# Patient Record
Sex: Female | Born: 1985 | Race: White | Hispanic: No | Marital: Married | Smoking: Former smoker
Health system: Southern US, Community
[De-identification: ages and names within clinical notes are randomized; demographics above are authoritative.]

## PROBLEM LIST (undated history)

## (undated) DIAGNOSIS — O139 Gestational [pregnancy-induced] hypertension without significant proteinuria, unspecified trimester: Secondary | ICD-10-CM

## (undated) DIAGNOSIS — F419 Anxiety disorder, unspecified: Secondary | ICD-10-CM

## (undated) DIAGNOSIS — F319 Bipolar disorder, unspecified: Secondary | ICD-10-CM

## (undated) DIAGNOSIS — I10 Essential (primary) hypertension: Secondary | ICD-10-CM

## (undated) DIAGNOSIS — F329 Major depressive disorder, single episode, unspecified: Secondary | ICD-10-CM

## (undated) DIAGNOSIS — IMO0002 Reserved for concepts with insufficient information to code with codable children: Secondary | ICD-10-CM

## (undated) DIAGNOSIS — F32A Depression, unspecified: Secondary | ICD-10-CM

## (undated) HISTORY — DX: Major depressive disorder, single episode, unspecified: F32.9

## (undated) HISTORY — DX: Essential (primary) hypertension: I10

## (undated) HISTORY — DX: Depression, unspecified: F32.A

## (undated) HISTORY — DX: Gestational (pregnancy-induced) hypertension without significant proteinuria, unspecified trimester: O13.9

## (undated) HISTORY — DX: Reserved for concepts with insufficient information to code with codable children: IMO0002

## (undated) HISTORY — DX: Anxiety disorder, unspecified: F41.9

---
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
## 2006-02-22 DIAGNOSIS — O139 Gestational [pregnancy-induced] hypertension without significant proteinuria, unspecified trimester: Secondary | ICD-10-CM

## 2006-02-22 HISTORY — DX: Gestational (pregnancy-induced) hypertension without significant proteinuria, unspecified trimester: O13.9

## 2006-05-08 ENCOUNTER — Observation Stay: Payer: Self-pay

## 2009-02-22 HISTORY — PX: WISDOM TOOTH EXTRACTION: SHX21

## 2009-05-07 ENCOUNTER — Emergency Department: Payer: Self-pay | Admitting: Emergency Medicine

## 2009-08-27 ENCOUNTER — Emergency Department (HOSPITAL_COMMUNITY): Admission: EM | Admit: 2009-08-27 | Discharge: 2009-08-28 | Payer: Self-pay | Admitting: Emergency Medicine

## 2010-01-07 ENCOUNTER — Emergency Department (HOSPITAL_COMMUNITY): Admission: EM | Admit: 2010-01-07 | Discharge: 2010-01-07 | Payer: Self-pay | Admitting: Emergency Medicine

## 2010-11-18 ENCOUNTER — Encounter: Payer: Self-pay | Admitting: Gynecology

## 2010-11-18 ENCOUNTER — Ambulatory Visit: Payer: Medicaid Other | Admitting: Gynecology

## 2010-11-18 VITALS — BP 124/68 | Wt 217.0 lb

## 2010-11-18 DIAGNOSIS — Z3480 Encounter for supervision of other normal pregnancy, unspecified trimester: Secondary | ICD-10-CM

## 2010-11-19 LAB — OBSTETRIC PANEL
Antibody Screen: NEGATIVE
Basophils Absolute: 0 10*3/uL (ref 0.0–0.1)
Basophils Relative: 0 % (ref 0–1)
Eosinophils Absolute: 0.3 10*3/uL (ref 0.0–0.7)
Eosinophils Relative: 2 % (ref 0–5)
HCT: 35.2 % — ABNORMAL LOW (ref 36.0–46.0)
MCHC: 34.4 g/dL (ref 30.0–36.0)
Monocytes Absolute: 0.7 10*3/uL (ref 0.1–1.0)
Neutro Abs: 7.6 10*3/uL (ref 1.7–7.7)
RDW: 13 % (ref 11.5–15.5)

## 2010-11-19 LAB — HEPATITIS B CORE ANTIBODY, IGM: Hep B C IgM: NEGATIVE

## 2010-11-19 LAB — HIV ANTIBODY (ROUTINE TESTING W REFLEX): HIV: NONREACTIVE

## 2010-11-20 LAB — CULTURE, URINE COMPREHENSIVE
Colony Count: NO GROWTH
Organism ID, Bacteria: NO GROWTH

## 2010-11-25 ENCOUNTER — Other Ambulatory Visit: Payer: Self-pay | Admitting: Gynecology

## 2010-11-25 ENCOUNTER — Ambulatory Visit (HOSPITAL_COMMUNITY)
Admission: RE | Admit: 2010-11-25 | Discharge: 2010-11-25 | Disposition: A | Discharge status: Home / Self Care | Payer: Medicaid Other | Source: Ambulatory Visit | Attending: Obstetrics and Gynecology | Admitting: Obstetrics and Gynecology

## 2010-11-25 DIAGNOSIS — Z3689 Encounter for other specified antenatal screening: Secondary | ICD-10-CM | POA: Insufficient documentation

## 2010-11-25 DIAGNOSIS — Z3480 Encounter for supervision of other normal pregnancy, unspecified trimester: Secondary | ICD-10-CM

## 2010-12-08 ENCOUNTER — Ambulatory Visit (INDEPENDENT_AMBULATORY_CARE_PROVIDER_SITE_OTHER): Payer: Medicaid Other | Admitting: Obstetrics and Gynecology

## 2010-12-08 ENCOUNTER — Other Ambulatory Visit (HOSPITAL_COMMUNITY)
Admission: RE | Admit: 2010-12-08 | Discharge: 2010-12-08 | Disposition: A | Discharge status: Home / Self Care | Payer: Medicaid Other | Source: Ambulatory Visit | Attending: Obstetrics and Gynecology | Admitting: Obstetrics and Gynecology

## 2010-12-08 DIAGNOSIS — Z01419 Encounter for gynecological examination (general) (routine) without abnormal findings: Secondary | ICD-10-CM | POA: Insufficient documentation

## 2010-12-08 DIAGNOSIS — Z3480 Encounter for supervision of other normal pregnancy, unspecified trimester: Secondary | ICD-10-CM

## 2010-12-08 DIAGNOSIS — Z113 Encounter for screening for infections with a predominantly sexual mode of transmission: Secondary | ICD-10-CM | POA: Insufficient documentation

## 2010-12-08 NOTE — Progress Notes (Signed)
Please collect quad screen

## 2010-12-08 NOTE — Patient Instructions (Signed)
Pregnancy - First Trimester During sexual intercourse, millions of sperm go into the vagina. Only 1 sperm will penetrate and fertilize the female egg while it is in the Fallopian tube. One week later, the fertilized egg implants into the wall of the uterus. An embryo begins to develop into a baby. At 6 to 8 weeks, the eyes and face are formed and the heartbeat can be seen on ultrasound. At the end of 12 weeks (first trimester), all the baby's organs are formed. Now that you are pregnant, you will want to do everything you can to have a healthy baby. Two of the most important things are to get good prenatal care and follow your caregiver's instructions. Prenatal care is all the medical care you receive before the baby's birth. It is given to prevent, find and treat problems during the pregnancy and childbirth. PRENATAL EXAMS:  During prenatal visits, your weight, blood pressure and urine are checked. This is done to make sure you are healthy and progressing normally during the pregnancy.   A pregnant woman should gain 25 to 35 pounds during the pregnancy. However, if you are over weight or underweight, your caregiver will advise you regarding your weight.   Your caregiver will ask and answer questions for you.   Blood work, cervical cultures, other necessary tests and a Pap test are done during your prenatal exams. These tests are done to check on your health and the probable health of your baby. Tests are strongly recommended and done for HIV with your permission. This is the virus that causes AIDS. These tests are done because medications can be given to help prevent your baby from being born with this infection should you have been infected without knowing it. Blood work is also used to find out your blood type, previous infections and follow your blood levels (hemoglobin).   Low hemoglobin (anemia) is common during pregnancy. Iron and vitamins are given to help prevent this. Later in the pregnancy,  blood tests for diabetes will be done along with any other tests if any problems develop. You may need tests to make sure you and the baby are doing well.   You may need other tests to make sure you and the baby are doing well.  CHANGES DURING THE FIRST TRIMESTER (THE FIRST 3 MONTHS OF PREGNANCY) Your body goes through many changes during pregnancy. They vary from person to person. Talk to your caregiver about changes you notice and are concerned about. Changes can include:  Your menstrual period stops.   The egg and sperm carry the genes that determine what you look like. Genes from you and your partner are forming a baby. The female genes determine whether the baby is a boy or a girl.   Your body increases in girth and you may feel bloated.   Feeling sick to your stomach (nauseous) and throwing up (vomiting). If the vomiting is uncontrollable, call your caregiver.   Your breasts will begin to enlarge and become tender.   Your nipples may stick out more and become darker.   The need to urinate more. Painful urination may mean you have a bladder infection.   Tiring easily.   Loss of appetite.   Cravings for certain kinds of food.   At first, you may gain or lose a couple of pounds.   You may have changes in your emotions from day to day (excited to be pregnant or concerned something may go wrong with the pregnancy and baby).     You may have more vivid and strange dreams.  HOME CARE INSTRUCTIONS  It is very important to avoid all smoking, alcohol and un-prescribed drugs during your pregnancy. These affect the formation and growth of the baby. Avoid chemicals while pregnant to ensure the delivery of a healthy infant.   Start your prenatal visits by the 12th week of pregnancy. They are usually scheduled monthly at first, then more often in the last 2 months before delivery. Keep your caregiver's appointments. Follow your caregiver's instructions regarding medication use, blood and lab  tests, exercise, and diet.   During pregnancy, you are providing food for you and your baby. Eat regular, well-balanced meals. Choose foods such as meat, fish, milk and other low fat dairy products, vegetables, fruits, and whole-grain breads and cereals. Your caregiver will tell you of the ideal weight gain.   You can help morning sickness by keeping soda crackers (saltines) at the bedside. Eat a couple before arising in the morning. You may want to use the crackers without salt on them.   Eating 4 to 5 small meals rather than 3 large meals a day also may help the nausea and vomiting.   Drinking liquids between meals instead of during meals also seems to help nausea and vomiting.   A physical sexual relationship may be continued throughout pregnancy if there are no other problems. Problems may be early (premature) leaking of amniotic fluid from the membranes, vaginal bleeding, or belly (abdominal) pain.   Exercise regularly if there are no restrictions. Check with your caregiver or physical therapist if you are unsure of the safety of some of your exercises. Greater weight gain will occur in the last 2 trimesters of pregnancy. Exercising will help:   Control your weight.   Keep you in shape.   Prepare you for labor and delivery.   Help you lose your pregnancy weight after you deliver your baby.   Wear a good support or jogging bra for breast tenderness during pregnancy. This may help if worn during sleep too.   Ask when prenatal classes are available. Begin classes when they are offered.   Do not use hot tubs, steam rooms or saunas.   Wear your seat belt when driving. This protects you and your baby if you are in an accident.   Avoid raw meat, uncooked cheese, cat litter boxes and soil used by cats throughout the pregnancy. These carry germs that can cause birth defects in the baby.   The first trimester is a good time to visit your dentist for your dental health. Getting your teeth  cleaned is OK. Use a softer toothbrush and brush gently during pregnancy.   Ask for help if you have financial, counseling or nutritional needs during pregnancy. Your caregiver will be able to offer counseling for these needs as well as refer you for other special needs.   Do not take any medications or herbs unless told by your caregiver.   Inform your caregiver if there is any mental or physical domestic violence.   Make a list of emergency phone numbers of family, friends, hospital, police and fire department.   Write down your questions. Take them to your prenatal visit.   Do not douche.   Do not cross your legs.   If you have to stand for long periods of time, rotate you feet or take small steps in a circle.   You may have more vaginal secretions that may require a sanitary pad. Do not use tampons   or scented sanitary pads.  MEDICATIONS AND DRUG USE IN PREGNANCY  Take prenatal vitamins as directed. The vitamin should contain 1 milligram of folic acid. Keep all vitamins out of reach of children. Only a couple vitamins or tablets containing iron may be fatal to a baby or young child when ingested.   Avoid use of all medications, including herbs, over-the-counter medications, not prescribed or suggested by your caregiver. Only take over-the-counter or prescription medicines for pain, discomfort, or fever as directed by your caregiver. Do not use aspirin, ibuprofen (Motrin, Advil, Nuprin) or naproxen (Aleve) unless OK'd by your caregiver.   Let your caregiver also know about herbs you may be using.   Alcohol is related to a number of birth defects. This includes fetal alcohol syndrome. All alcohol, in any form, should be avoided completely. Smoking will cause low birth rate and premature babies.   Street/illegal drugs are very harmful to the baby. They are absolutely forbidden. A baby born to an addicted mother will be addicted at birth. The baby will go through the same withdrawal  an adult does.   Let your caregiver know about any medications that you have to take and for what reason you take them.  MISCARRIAGE IS COMMON DURING PREGNANCY A miscarriage does not mean you did something wrong. It is not a reason to worry about getting pregnant again. Your caregiver will help you with questions you may have. If you have a miscarriage, you may need minor surgery (a D & C). SEEK MEDICAL CARE IF:  You have any concerns or worries during your pregnancy. It is better to call with your questions if you feel they cannot wait, rather than worry about them.  SEEK IMMEDIATE MEDICAL CARE IF:  An unexplained oral temperature above 100.4 develops, or as your caregiver suggests.   You have leaking of fluid from the vagina (birth canal). If leaking membranes are suspected, take your temperature and inform your caregiver of this when you call.   There is vaginal spotting or bleeding. Notify your caregiver of the amount and how many pads are used.   You develop a bad smelling vaginal discharge with a change in the color.   You continue to feel sick to your stomach (nauseated) and have no relief from remedies suggested. You vomit blood or coffee ground like materials.   You lose more than 2 pounds of weight in one week.   You gain more than 2 pounds of weight in a week and you notice swelling of your face, hands, feet or legs.   You gain 5 pounds or more in 1 week (even if you do not have swelling of your hands, face, legs or feet).   You get exposed to [place] measles and have never had them.   You are exposed to fifth disease or chicken pox.   You develop belly (abdominal) pain. Round ligament discomfort is a common non-cancerous (benign) cause of abdominal pain in pregnancy. Your caregiver still must evaluate this.   You develop headache, fever, diarrhea, pain with urination, or shortness of breath.   You fall, are in a car accident or have any kind of trauma.   There is mental  or physical violence in your home.  Document Released: 02/02/2001 Document Re-Released: 07/29/2009 [facility] Patient Information 2011 [place], [place]. Pregnancy - Second Trimester The second trimester of pregnancy (3 to 6 months) is a period of rapid growth for you and your baby. At the end of the sixth month, your  baby is about 9 inches long and weighs 1 1/2 pounds. You will begin to feel the baby move between 18 and 20 weeks of the pregnancy. This is called quickening. Weight gain is faster. A clear fluid (colostrum) may leak out of your breasts. You may feel small contractions of the womb (uterus). This is known as false labor or Braxton-Hicks contractions. This is like a practice for labor when the baby is ready to be born. Usually, the problems with morning sickness have usually passed by the end of your first trimester. Some women develop small dark blotches (called cholasma, mask of pregnancy) on their face that usually goes away after the baby is born. Exposure to the sun makes the blotches worse. Acne may also develop in some pregnant women and pregnant women who have acne, may find that it goes away. PRENATAL EXAMS  Blood work may continue to be done during prenatal exams. These tests are done to check on your health and the probable health of your baby. Blood work is used to follow your blood levels (hemoglobin). Anemia (low hemoglobin) is common during pregnancy. Iron and vitamins are given to help prevent this. You will also be checked for diabetes between 24 and 28 weeks of the pregnancy. Some of the previous blood tests may be repeated.   The size of the uterus is measured during each visit. This is to make sure that the baby is continuing to grow properly according to the dates of the pregnancy.   Your blood pressure is checked every prenatal visit. This is to make sure you are not getting toxemia.   Your urine is checked to make sure you do not have an infection, diabetes or protein in  the urine.   Your weight is checked often to make sure gains are happening at the suggested rate. This is to ensure that both you and your baby are growing normally.   Sometimes, an ultrasound is performed to confirm the proper growth and development of the baby. This is a test which bounces harmless sound waves off the baby so your caregiver can more accurately determine due dates.  Sometimes, a specialized test is done on the amniotic fluid surrounding the baby. This test is called an amniocentesis. The amniotic fluid is obtained by sticking a needle into the belly (abdomen). This is done to check the chromosomes in instances where there is a concern about possible genetic problems with the baby. It is also sometimes done near the end of pregnancy if an early delivery is required. In this case, it is done to help make sure the baby's lungs are mature enough for the baby to live outside of the womb. CHANGES OCCURING IN THE SECOND TRIMESTER OF PREGNANCY Your body goes through many changes during pregnancy. They vary from person to person. Talk to your caregiver about changes you notice that you are concerned about.  During the second trimester, you will likely have an increase in your appetite. It is normal to have cravings for certain foods. This varies from person to person and pregnancy to pregnancy.   Your lower abdomen will begin to bulge.   You may have to urinate more often because the uterus and baby are pressing on your bladder. It is also common to get more bladder infections during pregnancy (pain with urination). You can help this by drinking lots of fluids and emptying your bladder before and after intercourse.   You may begin to get stretch marks on your hips,  abdomen, and breasts. These are normal changes in the body during pregnancy. There are no exercises or medications to take that prevent this change.   You may begin to develop swollen and bulging veins (varicose veins) in your  legs. Wearing support hose, elevating your feet for 15 minutes, 3 to 4 times a day and limiting salt in your diet helps lessen the problem.   Heartburn may develop as the uterus grows and pushes up against the stomach. Antacids recommended by your caregiver helps with this problem. Also, eating smaller meals 4 to 5 times a day helps.   Constipation can be treated with a stool softener or adding bulk to your diet. Drinking lots of fluids, vegetables, fruits, and whole grains are helpful.   Exercising is also helpful. If you have been very active up until your pregnancy, most of these activities can be continued during your pregnancy. If you have been less active, it is helpful to start an exercise program such as walking.   Hemorrhoids (varicose veins in the rectum) may develop at the end of the second trimester. Warm sitz baths and hemorrhoid cream recommended by your caregiver helps hemorrhoid problems.   Backaches may develop during this time of your pregnancy. Avoid heavy lifting, wear low heal shoes and practice good posture to help with backache problems.   Some pregnant women develop tingling and numbness of their hand and fingers because of swelling and tightening of ligaments in the wrist (carpel tunnel syndrome). This goes away after the baby is born.   As your breasts enlarge, you may have to get a bigger bra. Get a comfortable, cotton, support bra. Do not get a nursing bra until the last month of the pregnancy if you will be nursing the baby.   You may get a dark line from your belly button to the pubic area called the linea nigra.   You may develop rosy cheeks because of increase blood flow to the face.   You may develop spider looking lines of the face, neck, arms and chest. These go away after the baby is born.  HOME CARE INSTRUCTIONS  It is extremely important to avoid all smoking, herbs, alcohol, and un-prescribed drugs during your pregnancy. These chemicals affect the  formation and growth of the baby. Avoid these chemicals throughout the pregnancy to ensure the delivery of a healthy infant.   Most of your home care instructions are the same as suggested for the first trimester of your pregnancy. Keep your caregiver's appointments. Follow your caregiver's instructions regarding medication use, exercise and diet.   During pregnancy, you are providing food for you and your baby. Continue to eat regular, well-balanced meals. Choose foods such as meat, fish, milk and other low fat dairy products, vegetables, fruits, and whole-grain breads and cereals. Your caregiver will tell you of the ideal weight gain.   A physical sexual relationship may be continued up until near the end of pregnancy if there are no other problems. Problems could include early (premature) leaking of amniotic fluid from the membranes, vaginal bleeding, abdominal pain, or other medical or pregnancy problems.   Exercise regularly if there are no restrictions. Check with your caregiver if you are unsure of the safety of some of your exercises. The greatest weight gain will occur in the last 2 trimesters of pregnancy. Exercise will help you:   Control your weight.   Get you in shape for labor and delivery.   Lose weight after you have the baby.  Wear a good support or jogging bra for breast tenderness during pregnancy. This may help if worn during sleep. Pads or tissues may be used in the bra if you are leaking colostrum.   Do not use hot tubs, steam rooms or saunas throughout the pregnancy.   Wear your seat belt at all times when driving. This protects you and your baby if you are in an accident.   Avoid raw meat, uncooked cheese, cat litter boxes and soil used by cats. These carry germs that can cause birth defects in the baby.   The second trimester is also a good time to visit your dentist for your dental health if this has not been done yet. Getting your teeth cleaned is OK. Use a soft  toothbrush. Brush gently during pregnancy.   It is easier to loose urine during pregnancy. Tightening up and strengthening the pelvic muscles will help with this problem. Practice stopping your urination while you are going to the bathroom. These are the same muscles you need to strengthen. It is also the muscles you would use as if you were trying to stop from passing gas. You can practice tightening these muscles up 10 times a set and repeating this about 3 times per day. Once you know what muscles to tighten up, do not perform these exercises during urination. It is more likely to contribute to an infection by backing up the urine.   Ask for help if you have financial, counseling or nutritional needs during pregnancy. Your caregiver will be able to offer counseling for these needs as well as refer you for other special needs.   Your skin may become oily. If so, wash your face with mild soap, use non-greasy moisturizer and oil or cream based makeup.  MEDICATIONS AND DRUG USE IN PREGNANCY  Take prenatal vitamins as directed. The vitamin should contain 1 milligram of folic acid. Keep all vitamins out of reach of children. Only a couple vitamins or tablets containing iron may be fatal to a baby or young child when ingested.   Avoid use of all medications, including herbs, over-the-counter medications, not prescribed or suggested by your caregiver. Only take over-the-counter or prescription medicines for pain, discomfort, or fever as directed by your caregiver. Do not use aspirin.   Let your caregiver also know about herbs you may be using.   Alcohol is related to a number of birth defects. This includes fetal alcohol syndrome. All alcohol, in any form, should be avoided completely. Smoking will cause low birth rate and premature babies.   Street/illegal drugs are very harmful to the baby. They are absolutely forbidden. A baby born to an addicted mother will be addicted at birth. The baby will go  through the same withdrawal an adult does.  SEEK MEDICAL CARE IF:  You have any concerns or worries during your pregnancy. It is better to call with your questions if you feel they cannot wait, rather than worry about them.  SEEK IMMEDIATE MEDICAL CARE IF:  An unexplained oral temperature above 100.4 develops, or as your caregiver suggests.   You have leaking of fluid from the vagina (birth canal). If leaking membranes are suspected, take your temperature and tell your caregiver of this when you call.   There is vaginal spotting, bleeding, or passing clots. Tell your caregiver of the amount and how many pads are used. Light spotting in pregnancy is common, especially following intercourse.   You develop a bad smelling vaginal discharge with a change  in the color from clear to white.   You continue to feel sick to your stomach (nauseated) and have no relief from remedies suggested. You vomit blood or coffee ground like materials.   You loose more than 2 pounds of weight or gain more than 2 pounds of weight over a weeks time, or as suggested by your caregiver.   You notice swelling of your face, hands, and feet or legs.   You get exposed to [place] measles and have never had them.   You are exposed to fifth disease or chicken pox.   You develop belly (abdominal) pain. Round ligament discomfort is a common non-cancerous (benign) cause of abdominal pain in pregnancy. Your caregiver still must evaluate you.   You develop a bad headache that does not go away.   You develop fever, diarrhea, pain with urination or shortness of breath.   You develop visual problems, blurry or double vision.   You fall, are in a car accident or any kind of trauma.   There is mental or physical violence at home.  Document Released: 02/02/2001 Document Re-Released: 05/05/2009 [facility] Patient Information 2011 [place], [place].

## 2010-12-08 NOTE — Progress Notes (Signed)
Addended by: Catalina Antigua on: 12/08/2010 03:08 PM   Modules accepted: Kipp Brood

## 2010-12-08 NOTE — Progress Notes (Signed)
   Subjective:    Lisa Rich is a G2P1001 [redacted]w[redacted]d being seen today for her first obstetrical visit.  Her obstetrical history is significant for pregnancy induced hypertension. Patient states that she was never started on any medicationsatient does intend to breast feed. Pregnancy history fully reviewed. Patient with h/o depression but not taking or feel that she needs medical intervention at this time  Patient reports no complaints.  Filed Vitals:   12/08/10 1400  BP: 119/74  Weight: 217 lb (98.431 kg)    HISTORY: OB History    Grav Para Term Preterm Abortions TAB SAB Ect Mult Living   2 1 1       1      # Outc Date GA Lbr Len/2nd Wgt Sex Del Anes PTL Lv   1 TRM 3/08 [redacted]w[redacted]d  7lb10oz(3.459kg) M SVD EPI  Yes   2 CUR              Past Medical History  Diagnosis Date  . Abnormal Pap smear 2007  . Pregnancy induced hypertension 2008    preclampsia  . Depression    Past Surgical History  Procedure Date  . Wisdom tooth extraction 2011     x 4   Family History  Problem Relation Age of Onset  . Hypertension Mother   . Depression Mother   . Hypertension Father   . Bipolar disorder Father   . Diabetes Maternal Grandmother   . Asthma Maternal Grandmother   . Heart disease Maternal Grandmother     CHF  . Heart disease Maternal Grandfather     CHF     Exam    Uterine Size: size equals dates  Pelvic Exam:    Perineum: Normal Perineum   Vulva: normal   Vagina:  normal mucosa, normal discharge   pH: n/a   Cervix: closed and long   Adnexa: normal adnexa and no mass, fullness, tenderness   Bony Pelvis: adequate  System: Breast:  normal appearance, no masses or tenderness, No nipple discharge or bleeding, No axillary or supraclavicular adenopathy   Skin: normal coloration and turgor, no rashes    Neurologic: oriented, grossly non-focal   Extremities: normal strength, tone, and muscle mass, no erythema, induration, or nodules   HEENT PERRLA   Mouth/Teeth mucous  membranes moist, pharynx normal without lesions   Neck supple and no masses   Cardiovascular: regular rate and rhythm   Respiratory:  chest clear to auscultation bilaterally   Abdomen: soft, non-tender; bowel sounds normal; no masses,  no organomegaly   Urinary: n/a      Assessment:    Pregnancy: G2P1001 There is no problem list on file for this patient.       Plan:     Initial labs drawn. Prenatal vitamins. Problem list reviewed and updated. Genetic Screening discussed Quad Screen: requested.  Ultrasound discussed; fetal survey: requested.  Follow up in 4 weeks.      Lisa Rich 12/08/2010

## 2010-12-08 NOTE — Progress Notes (Signed)
Addended by: Barbara Cower on: 12/08/2010 02:49 PM   Modules accepted: Orders

## 2010-12-17 ENCOUNTER — Inpatient Hospital Stay (HOSPITAL_COMMUNITY)
Admission: AD | Admit: 2010-12-17 | Discharge: 2010-12-17 | Disposition: A | Discharge status: Home / Self Care | Payer: Medicaid Other | Source: Ambulatory Visit | Attending: Obstetrics and Gynecology | Admitting: Obstetrics and Gynecology

## 2010-12-17 ENCOUNTER — Inpatient Hospital Stay (HOSPITAL_COMMUNITY): Payer: Medicaid Other

## 2010-12-17 ENCOUNTER — Encounter (HOSPITAL_COMMUNITY): Payer: Self-pay

## 2010-12-17 DIAGNOSIS — M549 Dorsalgia, unspecified: Secondary | ICD-10-CM

## 2010-12-17 DIAGNOSIS — R319 Hematuria, unspecified: Secondary | ICD-10-CM | POA: Insufficient documentation

## 2010-12-17 DIAGNOSIS — O26899 Other specified pregnancy related conditions, unspecified trimester: Secondary | ICD-10-CM

## 2010-12-17 DIAGNOSIS — O99891 Other specified diseases and conditions complicating pregnancy: Secondary | ICD-10-CM | POA: Insufficient documentation

## 2010-12-17 LAB — URINALYSIS, ROUTINE W REFLEX MICROSCOPIC
Glucose, UA: NEGATIVE mg/dL
Ketones, ur: 15 mg/dL — AB
Protein, ur: NEGATIVE mg/dL

## 2010-12-17 LAB — URINE MICROSCOPIC-ADD ON

## 2010-12-17 MED ORDER — PROMETHAZINE HCL 25 MG PO TABS: 25.0000 mg | ORAL_TABLET | Freq: Four times a day (QID) | ORAL | Status: AC | PRN

## 2010-12-17 NOTE — ED Provider Notes (Signed)
Agree with above note.  Lisa Rich H. 12/17/2010 6:54 PM

## 2010-12-17 NOTE — Progress Notes (Signed)
Patient states that she has been having bilateral flank pain that radiates to the lower abdomen. Was seen at [facility] today and had hematuria and was sent to MAU for evaluation.

## 2010-12-17 NOTE — Progress Notes (Signed)
Pt states blood noted in urine, having right sided lower back pain that wraps around to the left, mild CVA tenderness on R side only. Vaginal d/c white, milky, non-odorous. Denies vaginal bleeding. When pt voids, feels like a pulling sensation in lower abdomen.

## 2010-12-17 NOTE — Progress Notes (Signed)
TO US VIA W/C.

## 2010-12-17 NOTE — ED Provider Notes (Signed)
History   I have assumed care of this pt from Wynelle Bourgeois, CNM at 2013. Please see previous H&P.  Chief Complaint  Patient presents with  . Hematuria   HPI  OB History    Grav Para Term Preterm Abortions TAB SAB Ect Mult Living   2 1 1       1       Past Medical History  Diagnosis Date  . Abnormal Pap smear 2007  . Pregnancy induced hypertension 2008    preclampsia  . Depression     Past Surgical History  Procedure Date  . Wisdom tooth extraction 2011     x 4    Family History  Problem Relation Age of Onset  . Hypertension Mother   . Depression Mother   . Hypertension Father   . Bipolar disorder Father   . Diabetes Maternal Grandmother   . Asthma Maternal Grandmother   . Heart disease Maternal Grandmother     CHF  . Heart disease Maternal Grandfather     CHF    History  Substance Use Topics  . Smoking status: Current Some Day Smoker -- 0.2 packs/day    Types: Cigarettes  . Smokeless tobacco: Never Used  . Alcohol Use: No    Allergies: No Known Allergies  Prescriptions prior to admission  Medication Sig Dispense Refill  . acetaminophen (TYLENOL) 325 MG tablet Take 650 mg by mouth every 6 (six) hours as needed. For pain       . Prenatal Vit-Fe Fumarate-FA (MULTIVITAMIN-PRENATAL) 27-0.8 MG TABS Take 1 tablet by mouth daily.          ROS Physical Exam   Blood pressure 129/75, pulse 86, temperature 98.8 F (37.1 C), temperature source Oral, resp. rate 20, height 5\' 8"  (1.727 m), weight 217 lb 6.4 oz (98.612 kg), last menstrual period 09/04/2010, SpO2 99.00%.  Physical Exam  MAU Course  Procedures  Results for orders placed during the hospital encounter of 12/17/10 (from the past 24 hour(s))  URINALYSIS, ROUTINE W REFLEX MICROSCOPIC     Status: Abnormal   Collection Time   12/17/10  4:25 PM      Component Value Range   Color, Urine YELLOW  YELLOW    Appearance HAZY (*) CLEAR    Specific Gravity, Urine >1.030 (*) 1.005 - 1.030    pH 6.0  5.0 -  8.0    Glucose, UA NEGATIVE  NEGATIVE (mg/dL)   Hgb urine dipstick SMALL (*) NEGATIVE    Bilirubin Urine NEGATIVE  NEGATIVE    Ketones, ur 15 (*) NEGATIVE (mg/dL)   Protein, ur NEGATIVE  NEGATIVE (mg/dL)   Urobilinogen, UA 0.2  0.0 - 1.0 (mg/dL)   Nitrite NEGATIVE  NEGATIVE    Leukocytes, UA NEGATIVE  NEGATIVE   URINE MICROSCOPIC-ADD ON     Status: Abnormal   Collection Time   12/17/10  4:25 PM      Component Value Range   Squamous Epithelial / LPF FEW (*) RARE    WBC, UA 0-2  <3 (WBC/hpf)   RBC / HPF 3-6  <3 (RBC/hpf)   Bacteria, UA FEW (*) RARE    Crystals CA OXALATE CRYSTALS (*) NEGATIVE    Urine-Other MUCOUS PRESENT     US Renal  12/17/2010  *RADIOLOGY REPORT*  Clinical Data: Back pain.  Microhematuria. Calcium oxylate crystals.   Pregnant.  RENAL/URINARY TRACT ULTRASOUND COMPLETE  Comparison:  None.  Findings:  Right Kidney:  No hydronephrosis.  Well-preserved cortex.  Normal size and  parenchymal echotexture without focal abnormalities.   The maximal length is 10.7 cm.  Left Kidney:  No hydronephrosis.  Well-preserved cortex.  Normal size and parenchymal echotexture without focal abnormalities.   The maximal length is 11.2 cm  Bladder:  Bilateral ureteral jets are noted.  Incidental note is made of the pregnancy.  IMPRESSION: Normal appearance of the kidneys bilaterally without evidence for nephrolithiasis or hydronephrosis.  Original Report Authenticated By: Jamesetta Orleans. MATTERN, M.D.     Assessment and Plan  Back pain/hematuria: will await urine culture. Pt has f/u in the office scheduled. Willl give Rx for phenergan. Discussed diet, activity, risks, and precautions.  Clinton Gallant. Rice III, DrHSc, MPAS, PA-C  12/17/2010, 8:13 PM   Henrietta Hoover, PA 12/17/10 2015

## 2010-12-17 NOTE — ED Provider Notes (Signed)
History     Chief Complaint  Patient presents with  . Hematuria   HPI Lisa Rich is a 25 y.o. G1P0 at [redacted]w[redacted]d Who was seen at [facility] with c/o bilateral back pain which wraps around to front for several days. States they told her she had blood in her urine but "no signs of infection".  Was sent here for further eval .. No fever.   Past Medical History  Diagnosis Date  . Abnormal Pap smear 2007  . Pregnancy induced hypertension 2008    preclampsia  . Depression     Past Surgical History  Procedure Date  . Wisdom tooth extraction 2011     x 4    Family History  Problem Relation Age of Onset  . Hypertension Mother   . Depression Mother   . Hypertension Father   . Bipolar disorder Father   . Diabetes Maternal Grandmother   . Asthma Maternal Grandmother   . Heart disease Maternal Grandmother     CHF  . Heart disease Maternal Grandfather     CHF    History  Substance Use Topics  . Smoking status: Current Some Day Smoker -- 0.2 packs/day    Types: Cigarettes  . Smokeless tobacco: Never Used  . Alcohol Use: No    Allergies: No Known Allergies  Prescriptions prior to admission  Medication Sig Dispense Refill  . acetaminophen (TYLENOL) 325 MG tablet Take 650 mg by mouth every 6 (six) hours as needed. For pain       . Prenatal Vit-Fe Fumarate-FA (MULTIVITAMIN-PRENATAL) 27-0.8 MG TABS Take 1 tablet by mouth daily.          ROS As above  Physical Exam   Blood pressure 129/75, pulse 86, temperature 98.8 F (37.1 C), temperature source Oral, resp. rate 20, height 5\' 8"  (1.727 m), weight 217 lb 6.4 oz (98.612 kg), last menstrual period 09/04/2010, SpO2 99.00%.  Physical Exam  Constitutional: She is oriented to person, place, and time. She appears well-developed and well-nourished. No distress.  HENT:  Head: Normocephalic.  Respiratory: Effort normal.  GI: Soft. She exhibits no distension and no mass. There is no tenderness (No CVAT). There is no rebound  and no guarding.  Musculoskeletal: Normal range of motion.  Neurological: She is alert and oriented to person, place, and time.  Skin: Skin is warm and dry.  Psychiatric: She has a normal mood and affect. Her behavior is normal.    MAU Course  Procedures   Assessment and Plan  A:  IUP at 14.6 weeks      Back pain with hematuria, r/o stones P:  Will check [place]  [facility] 12/17/2010, 6:42 PM

## 2010-12-18 NOTE — ED Provider Notes (Signed)
Agree with above note.  Lisa Spalla H. 12/18/2010 6:34 AM

## 2011-01-11 ENCOUNTER — Ambulatory Visit (INDEPENDENT_AMBULATORY_CARE_PROVIDER_SITE_OTHER): Payer: Medicaid Other | Admitting: Obstetrics & Gynecology

## 2011-01-11 VITALS — BP 118/80 | Wt 221.0 lb

## 2011-01-11 DIAGNOSIS — Z23 Encounter for immunization: Secondary | ICD-10-CM

## 2011-01-11 DIAGNOSIS — E669 Obesity, unspecified: Secondary | ICD-10-CM

## 2011-01-11 DIAGNOSIS — Z3480 Encounter for supervision of other normal pregnancy, unspecified trimester: Secondary | ICD-10-CM

## 2011-01-11 DIAGNOSIS — O99210 Obesity complicating pregnancy, unspecified trimester: Secondary | ICD-10-CM | POA: Insufficient documentation

## 2011-01-11 NOTE — Progress Notes (Signed)
[place] would like to have her hours decreased due to her aches/pains. She was seen in the MAU recently and told she was having "growing pains". She points today on her abdomen where round ligament pain would be.  She will get her anatomy scan tomorrow and her Quad screen today as well as her flu shot. She denies VB, ROM, or CTXs.

## 2011-01-11 NOTE — Progress Notes (Signed)
Addended by: Allie Bossier on: 01/11/2011 04:13 PM   Modules accepted: Orders

## 2011-01-12 ENCOUNTER — Ambulatory Visit (HOSPITAL_COMMUNITY)
Admission: RE | Admit: 2011-01-12 | Discharge: 2011-01-12 | Disposition: A | Discharge status: Home / Self Care | Payer: Medicaid Other | Source: Ambulatory Visit | Attending: Obstetrics and Gynecology | Admitting: Obstetrics and Gynecology

## 2011-01-12 DIAGNOSIS — Z363 Encounter for antenatal screening for malformations: Secondary | ICD-10-CM | POA: Insufficient documentation

## 2011-01-12 DIAGNOSIS — Z1389 Encounter for screening for other disorder: Secondary | ICD-10-CM | POA: Insufficient documentation

## 2011-01-12 DIAGNOSIS — O358XX0 Maternal care for other (suspected) fetal abnormality and damage, not applicable or unspecified: Secondary | ICD-10-CM | POA: Insufficient documentation

## 2011-01-12 NOTE — Progress Notes (Signed)
Addended by: Barbara Cower on: 01/12/2011 02:54 PM   Modules accepted: Orders

## 2011-01-12 NOTE — Progress Notes (Signed)
Addended by: Barbara Cower on: 01/12/2011 02:11 PM   Modules accepted: Orders

## 2011-01-15 LAB — ALPHA FETOPROTEIN, MATERNAL
Open Spina bifida: NEGATIVE
Osb Risk: <1:27300 {titer}

## 2011-02-09 ENCOUNTER — Ambulatory Visit (INDEPENDENT_AMBULATORY_CARE_PROVIDER_SITE_OTHER): Payer: Medicaid Other | Admitting: Obstetrics and Gynecology

## 2011-02-09 DIAGNOSIS — O99210 Obesity complicating pregnancy, unspecified trimester: Secondary | ICD-10-CM

## 2011-02-09 DIAGNOSIS — Z3490 Encounter for supervision of normal pregnancy, unspecified, unspecified trimester: Secondary | ICD-10-CM

## 2011-02-09 DIAGNOSIS — Z3480 Encounter for supervision of other normal pregnancy, unspecified trimester: Secondary | ICD-10-CM | POA: Insufficient documentation

## 2011-02-09 DIAGNOSIS — E669 Obesity, unspecified: Secondary | ICD-10-CM

## 2011-02-09 NOTE — Progress Notes (Signed)
Patient doing well without concerns.

## 2011-02-23 NOTE — L&D Delivery Note (Signed)
Delivery Note At 6:35 AM a viable and healthy female was delivered via Vaginal, Spontaneous Delivery (Presentation: ; Occiput Anterior).  APGAR: 9, 9; weight 9 lb 6.3 oz (4260 g).   Placenta status: Intact, Spontaneous.  Cord: 3 vessels with the following complications: None.    Anesthesia: Epidural  Episiotomy: None Lacerations: 2nd degree;Perineal Suture Repair: 3.0 vicryl rapide Est. Blood Loss (mL): 400  Mom to postpartum.  Baby to nursery-stable.  Dmitri Pettigrew 06/03/2011, 7:24 AM

## 2011-03-04 ENCOUNTER — Ambulatory Visit (INDEPENDENT_AMBULATORY_CARE_PROVIDER_SITE_OTHER): Payer: Medicaid Other | Admitting: Obstetrics & Gynecology

## 2011-03-04 VITALS — BP 121/82 | Wt 234.0 lb

## 2011-03-04 DIAGNOSIS — Z3480 Encounter for supervision of other normal pregnancy, unspecified trimester: Secondary | ICD-10-CM

## 2011-03-04 DIAGNOSIS — Z3490 Encounter for supervision of normal pregnancy, unspecified, unspecified trimester: Secondary | ICD-10-CM

## 2011-03-04 NOTE — Progress Notes (Signed)
I discussed ACOG recommendations regarding weight gain and risks of excessive weight gain, offered a nutritionist consult. She will get a 1 hour glucola at [place]. She complains of the return of some of her panic attacks. She has pregnancy medicaid, so she can't go see her usual psychologist. We discussed meditation and guided imagery. She is familiar with this and uses these techniques. She is interested in LARC and breastfeeding.

## 2011-03-07 ENCOUNTER — Inpatient Hospital Stay (HOSPITAL_COMMUNITY)
Admission: AD | Admit: 2011-03-07 | Discharge: 2011-03-07 | Disposition: A | Discharge status: Home / Self Care | Payer: Medicaid Other | Source: Ambulatory Visit | Attending: Obstetrics and Gynecology | Admitting: Obstetrics and Gynecology

## 2011-03-07 ENCOUNTER — Encounter (HOSPITAL_COMMUNITY): Payer: Self-pay | Admitting: *Deleted

## 2011-03-07 DIAGNOSIS — O99340 Other mental disorders complicating pregnancy, unspecified trimester: Secondary | ICD-10-CM | POA: Insufficient documentation

## 2011-03-07 DIAGNOSIS — F419 Anxiety disorder, unspecified: Secondary | ICD-10-CM

## 2011-03-07 DIAGNOSIS — F411 Generalized anxiety disorder: Secondary | ICD-10-CM

## 2011-03-07 MED ORDER — HYDROXYZINE HCL 25 MG PO TABS: 50.0000 mg | ORAL_TABLET | Freq: Four times a day (QID) | ORAL | Status: AC | PRN

## 2011-03-07 NOTE — ED Provider Notes (Signed)
History     Chief Complaint  Patient presents with  . Panic Attack   HPI This is a 26 year old G2P1001 at 26 weeks and 2 days who presents to the MAU with anxiety/panic attacks that occur throughout the day.  Worse at night when trying to go to been.  Worse over the past week.  Prior to being pregnant, was on citalopram and klonopin, but have not took these since being pregnant.  Also took trazodone and vistaril at night to help her sleep.  Denies SI/HI.  Denies decreased fetal activity, vaginal bleeding, vaginal discharge.  Does get palpitations, tight chest, difficulty breathing when having panic attack.   OB History    Grav Para Term Preterm Abortions TAB SAB Ect Mult Living   2 1 1       1       Past Medical History  Diagnosis Date  . Abnormal Pap smear 2007  . Pregnancy induced hypertension 2008    preclampsia  . Depression     Past Surgical History  Procedure Date  . Wisdom tooth extraction 2011     x 4    Family History  Problem Relation Age of Onset  . Hypertension Mother   . Depression Mother   . Hypertension Father   . Bipolar disorder Father   . Diabetes Maternal Grandmother   . Asthma Maternal Grandmother   . Heart disease Maternal Grandmother     CHF  . Heart disease Maternal Grandfather     CHF    History  Substance Use Topics  . Smoking status: Current Some Day Smoker -- 0.2 packs/day    Types: Cigarettes  . Smokeless tobacco: Never Used  . Alcohol Use: No    Allergies: No Known Allergies  Prescriptions prior to admission  Medication Sig Dispense Refill  . acetaminophen (TYLENOL) 325 MG tablet Take 650 mg by mouth every 6 (six) hours as needed. For pain       . Prenatal Vit-Fe Fumarate-FA (MULTIVITAMIN-PRENATAL) 27-0.8 MG TABS Take 1 tablet by mouth daily.          ROS Physical Exam   Blood pressure 126/75, pulse 77, temperature 98.2 F (36.8 C), temperature source Oral, resp. rate 18, height 5\' 7"  (1.702 m), weight 105.779 kg (233 lb 3.2  oz), last menstrual period 09/04/2010, SpO2 98.00%.  Physical Exam  Constitutional: She is oriented to person, place, and time. She appears well-developed and well-nourished.  Cardiovascular: Normal rate.   Respiratory: Effort normal.  Neurological: She is alert and oriented to person, place, and time.  Skin: Skin is warm and dry.  Psychiatric: She has a normal mood and affect. Her behavior is normal. Judgment and thought content normal.    MAU Course  Procedures   Assessment and Plan  1.  IUP 2.  Anxiety  Will prescribe patient vistaril 50mg  q6 hours prn anxiety.  Recommended to take before bed to help with sleep.  Patient to reconnect with psychiatry as outpatient.  Patient stable to go home.    Jaydrien Wassenaar JEHIEL 03/07/2011, 10:59 PM

## 2011-03-07 NOTE — Progress Notes (Signed)
Pt reports she is only sleeping about 1 hour per night, awakens in a panic attack. Had a panic attack about 3 hours ago. Was on meds until about one month before she found out she was pregnant

## 2011-03-07 NOTE — Progress Notes (Signed)
Pt G2 P1 at 26.2wks having anxiety and panic attacks that seem to be getting worse.  Pt reports taking medication for panic attacks before pregnancy and stopped taking the medication with pregnancy.  Pt has itching, ears burning, heart racing, nausea, unable to sleep.

## 2011-03-15 ENCOUNTER — Encounter: Payer: Medicaid Other | Admitting: Family Medicine

## 2011-03-22 ENCOUNTER — Other Ambulatory Visit (INDEPENDENT_AMBULATORY_CARE_PROVIDER_SITE_OTHER): Payer: Medicaid Other | Admitting: *Deleted

## 2011-03-22 DIAGNOSIS — Z3480 Encounter for supervision of other normal pregnancy, unspecified trimester: Secondary | ICD-10-CM

## 2011-04-05 ENCOUNTER — Ambulatory Visit (INDEPENDENT_AMBULATORY_CARE_PROVIDER_SITE_OTHER): Payer: Medicaid Other | Admitting: Obstetrics & Gynecology

## 2011-04-05 VITALS — BP 130/80 | Wt 245.0 lb

## 2011-04-05 DIAGNOSIS — Z3480 Encounter for supervision of other normal pregnancy, unspecified trimester: Secondary | ICD-10-CM

## 2011-04-05 NOTE — Progress Notes (Signed)
Routine visit. 1 hour glucola 82. She has no concerns or questions. Good FM, no VB or ROM or CTXs.

## 2011-04-05 NOTE — Progress Notes (Signed)
Patient here for routine prenatal visit.  She was seen at the ER and given a prescription for a bad anxiety attack.  She has only taken it once since she went.  She has seen a therapist in the past and tries to use breathing techniques and exercise to handle her anxiety.

## 2011-04-19 ENCOUNTER — Ambulatory Visit (INDEPENDENT_AMBULATORY_CARE_PROVIDER_SITE_OTHER): Payer: Medicaid Other | Admitting: Family Medicine

## 2011-04-19 DIAGNOSIS — Z3480 Encounter for supervision of other normal pregnancy, unspecified trimester: Secondary | ICD-10-CM

## 2011-04-19 DIAGNOSIS — Z8759 Personal history of other complications of pregnancy, childbirth and the puerperium: Secondary | ICD-10-CM

## 2011-04-19 DIAGNOSIS — O09299 Supervision of pregnancy with other poor reproductive or obstetric history, unspecified trimester: Secondary | ICD-10-CM

## 2011-04-19 HISTORY — DX: Personal history of other complications of pregnancy, childbirth and the puerperium: Z87.59

## 2011-04-19 NOTE — Patient Instructions (Signed)
Pregnancy - Third Trimester The third trimester of pregnancy (the last 3 months) is a period of the most rapid growth for you and your baby. The baby approaches a length of 20 inches and a weight of 6 to 10 pounds. The baby is adding on fat and getting ready for life outside your body. While inside, babies have periods of sleeping and waking, suck their thumbs, and hiccups. You can often feel small contractions of the uterus. This is false labor. It is also called Braxton-Hicks contractions. This is like a practice for labor. The usual problems in this stage of pregnancy include more difficulty breathing, swelling of the hands and feet from water retention, and having to urinate more often because of the uterus and baby pressing on your bladder.  PRENATAL EXAMS  Blood work may continue to be done during prenatal exams. These tests are done to check on your health and the probable health of your baby. Blood work is used to follow your blood levels (hemoglobin). Anemia (low hemoglobin) is common during pregnancy. Iron and vitamins are given to help prevent this. You may also continue to be checked for diabetes. Some of the past blood tests may be done again.   The size of the uterus is measured during each visit. This makes sure your baby is growing properly according to your pregnancy dates.   Your blood pressure is checked every prenatal visit. This is to make sure you are not getting toxemia.   Your urine is checked every prenatal visit for infection, diabetes and protein.   Your weight is checked at each visit. This is done to make sure gains are happening at the suggested rate and that you and your baby are growing normally.   Sometimes, an ultrasound is performed to confirm the position and the proper growth and development of the baby. This is a test done that bounces harmless sound waves off the baby so your caregiver can more accurately determine due dates.   Discuss the type of pain  medication and anesthesia you will have during your labor and delivery.   Discuss the possibility and anesthesia if a Cesarean Section might be necessary.   Inform your caregiver if there is any mental or physical violence at home.  Sometimes, a specialized non-stress test, contraction stress test and biophysical profile are done to make sure the baby is not having a problem. Checking the amniotic fluid surrounding the baby is called an amniocentesis. The amniotic fluid is removed by sticking a needle into the belly (abdomen). This is sometimes done near the end of pregnancy if an early delivery is required. In this case, it is done to help make sure the baby's lungs are mature enough for the baby to live outside of the womb. If the lungs are not mature and it is unsafe to deliver the baby, an injection of cortisone medication is given to the mother 1 to 2 days before the delivery. This helps the baby's lungs mature and makes it safer to deliver the baby. CHANGES OCCURING IN THE THIRD TRIMESTER OF PREGNANCY Your body goes through many changes during pregnancy. They vary from person to person. Talk to your caregiver about changes you notice and are concerned about.  During the last trimester, you have probably had an increase in your appetite. It is normal to have cravings for certain foods. This varies from person to person and pregnancy to pregnancy.   You may begin to get stretch marks on your hips,   abdomen, and breasts. These are normal changes in the body during pregnancy. There are no exercises or medications to take which prevent this change.   Constipation may be treated with a stool softener or adding bulk to your diet. Drinking lots of fluids, fiber in vegetables, fruits, and whole grains are helpful.   Exercising is also helpful. If you have been very active up until your pregnancy, most of these activities can be continued during your pregnancy. If you have been less active, it is helpful  to start an exercise program such as walking. Consult your caregiver before starting exercise programs.   Avoid all smoking, alcohol, un-prescribed drugs, herbs and "street drugs" during your pregnancy. These chemicals affect the formation and growth of the baby. Avoid chemicals throughout the pregnancy to ensure the delivery of a healthy infant.   Backache, varicose veins and hemorrhoids may develop or get worse.   You will tire more easily in the third trimester, which is normal.   The baby's movements may be stronger and more often.   You may become short of breath easily.   Your belly button may stick out.   A yellow discharge may leak from your breasts called colostrum.   You may have a bloody mucus discharge. This usually occurs a few days to a week before labor begins.  HOME CARE INSTRUCTIONS   Keep your caregiver's appointments. Follow your caregiver's instructions regarding medication use, exercise, and diet.   During pregnancy, you are providing food for you and your baby. Continue to eat regular, well-balanced meals. Choose foods such as meat, fish, milk and other low fat dairy products, vegetables, fruits, and whole-grain breads and cereals. Your caregiver will tell you of the ideal weight gain.   A physical sexual relationship may be continued throughout pregnancy if there are no other problems such as early (premature) leaking of amniotic fluid from the membranes, vaginal bleeding, or belly (abdominal) pain.   Exercise regularly if there are no restrictions. Check with your caregiver if you are unsure of the safety of your exercises. Greater weight gain will occur in the last 2 trimesters of pregnancy. Exercising helps:   Control your weight.   Get you in shape for labor and delivery.   You lose weight after you deliver.   Rest a lot with legs elevated, or as needed for leg cramps or low back pain.   Wear a good support or jogging bra for breast tenderness during  pregnancy. This may help if worn during sleep. Pads or tissues may be used in the bra if you are leaking colostrum.   Do not use hot tubs, steam rooms, or saunas.   Wear your seat belt when driving. This protects you and your baby if you are in an accident.   Avoid raw meat, cat litter boxes and soil used by cats. These carry germs that can cause birth defects in the baby.   It is easier to loose urine during pregnancy. Tightening up and strengthening the pelvic muscles will help with this problem. You can practice stopping your urination while you are going to the bathroom. These are the same muscles you need to strengthen. It is also the muscles you would use if you were trying to stop from passing gas. You can practice tightening these muscles up 10 times a set and repeating this about 3 times per day. Once you know what muscles to tighten up, do not perform these exercises during urination. It is more likely   to cause an infection by backing up the urine.   Ask for help if you have financial, counseling or nutritional needs during pregnancy. Your caregiver will be able to offer counseling for these needs as well as refer you for other special needs.   Make a list of emergency phone numbers and have them available.   Plan on getting help from family or friends when you go home from the hospital.   Make a trial run to the hospital.   Take prenatal classes with the father to understand, practice and ask questions about the labor and delivery.   Prepare the baby's room/nursery.   Do not travel out of the city unless it is absolutely necessary and with the advice of your caregiver.   Wear only low or no heal shoes to have better balance and prevent falling.  MEDICATIONS AND DRUG USE IN PREGNANCY  Take prenatal vitamins as directed. The vitamin should contain 1 milligram of folic acid. Keep all vitamins out of reach of children. Only a couple vitamins or tablets containing iron may be fatal  to a baby or young child when ingested.   Avoid use of all medications, including herbs, over-the-counter medications, not prescribed or suggested by your caregiver. Only take over-the-counter or prescription medicines for pain, discomfort, or fever as directed by your caregiver. Do not use aspirin, ibuprofen (Motrin, Advil, Nuprin) or naproxen (Aleve) unless OK'd by your caregiver.   Let your caregiver also know about herbs you may be using.   Alcohol is related to a number of birth defects. This includes fetal alcohol syndrome. All alcohol, in any form, should be avoided completely. Smoking will cause low birth rate and premature babies.   Street/illegal drugs are very harmful to the baby. They are absolutely forbidden. A baby born to an addicted mother will be addicted at birth. The baby will go through the same withdrawal an adult does.  SEEK MEDICAL CARE IF: You have any concerns or worries during your pregnancy. It is better to call with your questions if you feel they cannot wait, rather than worry about them. DECISIONS ABOUT CIRCUMCISION You may or may not know the sex of your baby. If you know your baby is a boy, it may be time to think about circumcision. Circumcision is the removal of the foreskin of the penis. This is the skin that covers the sensitive end of the penis. There is no proven medical need for this. Often this decision is made on what is popular at the time or based upon religious beliefs and social issues. You can discuss these issues with your caregiver or pediatrician. SEEK IMMEDIATE MEDICAL CARE IF:   An unexplained oral temperature above 102 F (38.9 C) develops, or as your caregiver suggests.   You have leaking of fluid from the vagina (birth canal). If leaking membranes are suspected, take your temperature and tell your caregiver of this when you call.   There is vaginal spotting, bleeding or passing clots. Tell your caregiver of the amount and how many pads are  used.   You develop a bad smelling vaginal discharge with a change in the color from clear to white.   You develop vomiting that lasts more than 24 hours.   You develop chills or fever.   You develop shortness of breath.   You develop burning on urination.   You loose more than 2 pounds of weight or gain more than 2 pounds of weight or as suggested by your   caregiver.   You notice sudden swelling of your face, hands, and feet or legs.   You develop belly (abdominal) pain. Round ligament discomfort is a common non-cancerous (benign) cause of abdominal pain in pregnancy. Your caregiver still must evaluate you.   You develop a severe headache that does not go away.   You develop visual problems, blurred or double vision.   If you have not felt your baby move for more than 1 hour. If you think the baby is not moving as much as usual, eat something with sugar in it and lie down on your left side for an hour. The baby should move at least 4 to 5 times per hour. Call right away if your baby moves less than that.   You fall, are in a car accident or any kind of trauma.   There is mental or physical violence at home.  Document Released: 02/02/2001 Document Revised: 10/21/2010 Document Reviewed: 08/07/2008 [employer] Patient Information 2012 [employer]. Birth Control Choices Birth control is the use of any practices, methods, or devices to prevent pregnancy from happening in a sexually active woman.  Below are some birth control choices to help avoid pregnancy.  Not having sex (abstinence) is the surest form of birth control. This requires self-control. There is no risk of acquiring a sexually transmitted disease (STD), including acquired immunodeficiency syndrome (AIDS).   Periodic abstinence requires self-control during certain times of the month.   Calendar method, timing your menstrual periods from month to month.   Ovulation method is avoiding sexual intercourse around the time  you produce an egg (ovulate).   Symptotherm method is avoiding sexual intercourse at the time of ovulation, using a thermometer and ovulation symptoms.   Post ovulation method is the timing of sexual intercourse after you ovulated.  These methods do not protect against STDs, including AIDS.  Birth control pills (BCPs) contain estrogen and progesterone hormone. These medicines work by stopping the egg from forming in the ovary (ovulation). Birth control pills are prescribed by a caregiver who will ask you questions about the risks of taking BCPs. Birth control pills do not protect against STDs, including AIDS.   "Minipill" birth control pills have only the progesterone hormone. They are taken every day of each month and must be prescribed by your caregiver. They do not protect against STDs, including AIDS.   Emergency contraception is often call the "morning after" pill. This pill can be taken right after sex or up to five days after sex if you think your birth control failed, you failed to use contraception, or you were forced to have sex. It is most effective the sooner you take the pills after having sexual intercourse. Do not use emergency contraception as your only form of birth control. Emergency contraceptive pills are available without a prescription. Check with your pharmacist.   Condoms are a thin sheath of latex, synthetic material, or lambskin worn over the penis during sexual intercourse. They can have a spermicide in or on them when you buy them. Latex condoms can prevent pregnancy and STDs. "Natural" or lambskin condoms can prevent pregnancy but may not protect against STDs, including AIDS.   Female condoms are a soft, loose-fitting sheath that is put into the vagina before sexual intercourse. They can prevent pregnancy and STDs, including AIDS.   Sponge is a soft, circular piece of polyurethane foam with spermicide in it that is inserted into the vagina after wetting it and before  sexual intercourse. It does   not require a prescription from your caregiver. It does not protect against STDs, including AIDS.   Diaphragm is a soft, latex, dome-shaped barrier that must be fitted by a caregiver. It is inserted into the vagina, along with a spermicidal jelly. After the proper fitting for a diaphragm, always insert the diaphragm before intercourse. The diaphragm should be left in the vagina for 6 to 8 hours after intercourse. Removal and reinsertion with a spermicide is always necessary after any use. It does not protect against STDs, including AIDS.   Progesterone-only injections are given every 3 months to prevent pregnancy. These injections contain synthetic progesterone and no estrogen. This hormone stops the ovaries from releasing eggs. It also causes the cervical mucus to thicken and changes the uterine lining. This makes it harder for sperm to survive in the uterus. It does not protect against STDs, including AIDS.   Birth Control Patch contains hormones similar to those in birth control pills, so effectiveness, risks, and side effects are similar. It must be changed once a week and is prescribed by a caregiver. It is less effective in very overweight women. It does not protect against STDs, including AIDS.   Vaginal Ring contains hormones similar to those in birth control pills. It is left in place for 3 weeks, removed for 1 week, and then a new one is put back into the vagina. It comes with a timer to put in your purse to help you remember when to take it out or put a new one in. A caregiver's examination and prescription is necessary, just like with birth control pills and the patch. It does not protect against STDs, including AIDS.   Estrogen plus progesterone injections are given every 28 to 30 days. They can be given in the upper arm, thigh, or buttocks. It does not protect against STDs, including AIDS.   Intrauterine device (IUD): copper T or progestin filled is a T-shaped  device that is put in a woman's uterus during a menstrual period to prevent pregnancy. The copper T IUD can last 10 years, and the progestin IUD can last 5 years. The progestin IUD can also help control heavy menstrual periods. It does not protect against STDs, including AIDS. The copper T IUD can be used as emergency contraception if inserted within 5 days of having unprotected intercourse.   Cervical cap is a round, soft latex or plastic cup that fits over the cervix and must be fitted by a caregiver. You do not need to use a spermicide with it or remove and insert it every time you have sexual intercourse. It does not protect against STDs, including AIDS.   Spermicides are chemicals that kill or block sperm from entering the cervix and uterus. They come in the form of creams, jellies, suppositories, foam, or tablets, and they do not require a prescription. They are inserted into the vagina with an applicator before having sexual intercourse. This must be repeated every time you have sexual intercourse.   Withdrawal is using the method of the female withdrawing his penis from sexual intercourse before he has a climax and deposits his sperm. It does not protect against STDs, including AIDS.   Female tubal ligation is when the woman's fallopian tubes are surgically sealed or tied to prevent the egg from traveling to the uterus. It does not protect against STDs, including AIDS.   Female sterilization is when the female has his tubes that carry sperm tied off (vasectomy) to stop sperm from entering the   vagina during sexual intercourse. It does not protect against STDs, including AIDS.  Regardless of which method of birth control you choose, it is still important that you use some form of protection against STDs. Document Released: 02/08/2005 Document Revised: 03/13/2010 Document Reviewed: 12/26/2008 [employer] Patient Information 2012 [employer]. Breastfeeding BENEFITS OF BREASTFEEDING For the baby  The  first milk (colostrum) helps the baby's digestive system function better.   There are antibodies from the mother in the milk that help the baby fight off infections.   The baby has a lower incidence of asthma, allergies, and SIDS (sudden infant death syndrome).   The nutrients in breast milk are better than formulas for the baby and helps the baby's brain grow better.   Babies who breastfeed have less gas, colic, and constipation.  For the mother  Breastfeeding helps develop a very special bond between mother and baby.   It is more convenient, always available at the correct temperature and cheaper than formula feeding.   It burns calories in the mother and helps with losing weight that was gained during pregnancy.   It makes the uterus contract back down to normal size faster and slows bleeding following delivery.   Breastfeeding mothers have a lower risk of developing breast cancer.  NURSE FREQUENTLY  A healthy, full-term baby may breastfeed as often as every hour or space his or her feedings to every 3 hours.   How often to nurse will vary from baby to baby. Watch your baby for signs of hunger, not the clock.   Nurse as often as the baby requests, or when you feel the need to reduce the fullness of your breasts.   Awaken the baby if it has been 3 to 4 hours since the last feeding.   Frequent feeding will help the mother make more milk and will prevent problems like sore nipples and engorgement of the breasts.  BABY'S POSITION AT THE BREAST  Whether lying down or sitting, be sure that the baby's tummy is facing your tummy.   Support the breast with 4 fingers underneath the breast and the thumb above. Make sure your fingers are well away from the nipple and baby's mouth.   Stroke the baby's lips and cheek closest to the breast gently with your finger or nipple.   When the baby's mouth is open wide enough, place all of your nipple and as much of the dark area around the nipple  as possible into your baby's mouth.   Pull the baby in close so the tip of the nose and the baby's cheeks touch the breast during the feeding.  FEEDINGS  The length of each feeding varies from baby to baby and from feeding to feeding.   The baby must suck about 2 to 3 minutes for your milk to get to him or her. This is called a "let down." For this reason, allow the baby to feed on each breast as long as he or she wants. Your baby will end the feeding when he or she has received the right balance of nutrients.   To break the suction, put your finger into the corner of the baby's mouth and slide it between his or her gums before removing your breast from his or her mouth. This will help prevent sore nipples.  REDUCING BREAST ENGORGEMENT  In the first week after your baby is born, you may experience signs of breast engorgement. When breasts are engorged, they feel heavy, warm, full, and may   be tender to the touch. You can reduce engorgement if you:   Nurse frequently, every 2 to 3 hours. Mothers who breastfeed early and often have fewer problems with engorgement.   Place light ice packs on your breasts between feedings. This reduces swelling. Wrap the ice packs in a lightweight towel to protect your skin.   Apply moist hot packs to your breast for 5 to 10 minutes before each feeding. This increases circulation and helps the milk flow.   Gently massage your breast before and during the feeding.   Make sure that the baby empties at least one breast at every feeding before switching sides.   Use a breast pump to empty the breasts if your baby is sleepy or not nursing well. You may also want to pump if you are returning to work or or you feel you are getting engorged.   Avoid bottle feeds, pacifiers or supplemental feedings of water or juice in place of breastfeeding.   Be sure the baby is latched on and positioned properly while breastfeeding.   Prevent fatigue, stress, and anemia.   Wear  a supportive bra, avoiding underwire styles.   Eat a balanced diet with enough fluids.  If you follow these suggestions, your engorgement should improve in 24 to 48 hours. If you are still experiencing difficulty, call your lactation consultant or caregiver. IS MY BABY GETTING ENOUGH MILK? Sometimes, mothers worry about whether their babies are getting enough milk. You can be assured that your baby is getting enough milk if:  The baby is actively sucking and you hear swallowing.   The baby nurses at least 8 to 12 times in a 24 hour time period. Nurse your baby until he or she unlatches or falls asleep at the first breast (at least 10 to 20 minutes), then offer the second side.   The baby is wetting 5 to 6 disposable diapers (6 to 8 cloth diapers) in a 24 hour period by 5 to 6 days of age.   The baby is having at least 2 to 3 stools every 24 hours for the first few months. Breast milk is all the food your baby needs. It is not necessary for your baby to have water or formula. In fact, to help your breasts make more milk, it is best not to give your baby supplemental feedings during the early weeks.   The stool should be soft and yellow.   The baby should gain 4 to 7 ounces per week after he is 4 days old.  TAKE CARE OF YOURSELF Take care of your breasts by:  Bathing or showering daily.   Avoiding the use of soaps on your nipples.   Start feedings on your left breast at one feeding and on your right breast at the next feeding.   You will notice an increase in your milk supply 2 to 5 days after delivery. You may feel some discomfort from engorgement, which makes your breasts very firm and often tender. Engorgement "peaks" out within 24 to 48 hours. In the meantime, apply warm moist towels to your breasts for 5 to 10 minutes before feeding. Gentle massage and expression of some milk before feeding will soften your breasts, making it easier for your baby to latch on. Wear a well fitting nursing  bra and air dry your nipples for 10 to 15 minutes after each feeding.   Only use cotton bra pads.   Only use pure lanolin on your nipples after nursing. You   do not need to wash it off before nursing.  Take care of yourself by:   Eating well-balanced meals and nutritious snacks.   Drinking milk, fruit juice, and water to satisfy your thirst (about 8 glasses a day).   Getting plenty of rest.   Increasing calcium in your diet (1200 mg a day).   Avoiding foods that you notice affect the baby in a bad way.  SEEK MEDICAL CARE IF:   You have any questions or difficulty with breastfeeding.   You need help.   You have a hard, red, sore area on your breast, accompanied by a fever of 100.5 F (38.1 C) or more.   Your baby is too sleepy to eat well or is having trouble sleeping.   Your baby is wetting less than 6 diapers per day, by 5 days of age.   Your baby's skin or white part of his or her eyes is more yellow than it was in the hospital.   You feel depressed.  Document Released: 02/08/2005 Document Revised: 10/21/2010 Document Reviewed: 09/23/2008 Exit[employer] Patient Information 2012 [employer]. 

## 2011-04-19 NOTE — Progress Notes (Signed)
Doing well.  No problems

## 2011-05-03 ENCOUNTER — Ambulatory Visit (INDEPENDENT_AMBULATORY_CARE_PROVIDER_SITE_OTHER): Payer: Medicaid Other | Admitting: Obstetrics & Gynecology

## 2011-05-03 VITALS — BP 129/76 | Wt 262.0 lb

## 2011-05-03 DIAGNOSIS — Z3480 Encounter for supervision of other normal pregnancy, unspecified trimester: Secondary | ICD-10-CM

## 2011-05-03 NOTE — Progress Notes (Signed)
Routine visit. Complains of some pelvic pressure. Reassurance given. PTL precautions given.

## 2011-05-10 ENCOUNTER — Other Ambulatory Visit: Payer: Self-pay | Admitting: Gynecology

## 2011-05-11 ENCOUNTER — Ambulatory Visit (INDEPENDENT_AMBULATORY_CARE_PROVIDER_SITE_OTHER): Payer: Medicaid Other | Admitting: Family Medicine

## 2011-05-11 VITALS — BP 133/88 | Wt 267.0 lb

## 2011-05-11 DIAGNOSIS — Z3480 Encounter for supervision of other normal pregnancy, unspecified trimester: Secondary | ICD-10-CM

## 2011-05-11 DIAGNOSIS — O139 Gestational [pregnancy-induced] hypertension without significant proteinuria, unspecified trimester: Secondary | ICD-10-CM

## 2011-05-11 LAB — COMPREHENSIVE METABOLIC PANEL
AST: 15 U/L (ref 0–37)
BUN: 6 mg/dL (ref 6–23)
Calcium: 8.9 mg/dL (ref 8.4–10.5)
Chloride: 106 mEq/L (ref 96–112)
Creat: 0.49 mg/dL — ABNORMAL LOW (ref 0.50–1.10)
Glucose, Bld: 110 mg/dL — ABNORMAL HIGH (ref 70–99)

## 2011-05-11 NOTE — Progress Notes (Signed)
Not feeling well.  Reports some decreased fetal movement, although I felt 4 distinct movements in 5 mins. BP mildly elevated and having headache.  Will check labs.

## 2011-05-11 NOTE — Patient Instructions (Addendum)
Preeclampsia and Eclampsia Preeclampsia is a condition of high blood pressure during pregnancy. It can happen at 20 weeks or later in pregnancy. If high blood pressure occurs in the second half of pregnancy with no other symptoms, it is called gestational hypertension and goes away after the baby is born. If any of the symptoms listed below develop with gestational hypertension, it is then called preeclampsia. Eclampsia (convulsions) may follow preeclampsia. This is one of the reasons for regular prenatal checkups. Early diagnosis and treatment are very important to prevent eclampsia. CAUSES  There is no known cause of preeclampsia/eclampsia in pregnancy. There are several known conditions that may put the pregnant woman at risk, such as:  The first pregnancy.   Having preeclampsia in a past pregnancy.   Having lasting (chronic) high blood pressure.   Having multiples (twins, triplets).   Being age 35 or older.   African American ethnic background.   Having kidney disease or diabetes.   Medical conditions such as lupus or blood diseases.   Being overweight (obese).  SYMPTOMS   High blood pressure.   Headaches.   Sudden weight gain.   Swelling of hands, face, legs, and feet.   Protein in the urine.   Feeling sick to your stomach (nauseous) and throwing up (vomiting).   Vision problems (blurred or double vision).   Numbness in the face, arms, legs, and feet.   Dizziness.   Slurred speech.   Preeclampsia can cause growth retardation in the fetus.   Separation (abruption) of the placenta.   Not enough fluid in the amniotic sac (oligohydramnios).   Sensitivity to bright lights.   Belly (abdominal) pain.  DIAGNOSIS  If protein is found in the urine in the second half of pregnancy, this is considered preeclampsia. Other symptoms mentioned above may also be present. TREATMENT  It is necessary to treat this.  Your caregiver may prescribe bed rest early in this  condition. Plenty of rest and salt restriction may be all that is needed.   Medicines may be necessary to lower blood pressure if the condition does not respond to more conservative measures.   In more severe cases, hospitalization may be needed:   For treatment of blood pressure.   To control fluid retention.   To monitor the baby to see if the condition is causing harm to the baby.   Hospitalization is the best way to treat the first sign of preeclampsia. This is so the mother and baby can be watched closely and blood tests can be done effectively and correctly.   If the condition becomes severe, it may be necessary to induce labor or to remove the infant by surgical means (cesarean section). The best cure for preeclampsia/eclampsia is to deliver the baby.  Preeclampsia and eclampsia involve risks to mother and infant. Your caregiver will discuss these risks with you. Together, you can work out the best possible approach to your problems. Make sure you keep your prenatal visits as scheduled. Not keeping appointments could result in a chronic or permanent injury, pain, disability to you, and death or injury to you or your unborn baby. If there is any problem keeping the appointment, you must call to reschedule. HOME CARE INSTRUCTIONS   Keep your prenatal appointments and tests as scheduled.   Tell your caregiver if you have any of the above risk factors.   Get plenty of rest and sleep.   Eat a balanced diet that is low in salt, and do not add salt   to your food.   Avoid stressful situations.   Only take over-the-counter and prescriptions medicines for pain, discomfort, or fever as directed by your caregiver.  SEEK IMMEDIATE MEDICAL CARE IF:   You develop severe swelling anywhere in the body. This usually occurs in the legs.   You gain 5 lb/2.3 kg or more in a week.   You develop a severe headache, dizziness, problems with your vision, or confusion.   You have abdominal pain,  nausea, or vomiting.   You have a seizure.   You have trouble moving any part of your body, or you develop numbness or problems speaking.   You have bruising or abnormal bleeding from anywhere in the body.   You develop a stiff neck.   You pass out.  MAKE SURE YOU:   Understand these instructions.   Will watch your condition.   Will get help right away if you are not doing well or get worse.  Document Released: 02/06/2000 Document Revised: 01/28/2011 Document Reviewed: 09/22/2007 Exit[employer] Patient Information 2012 [employer]. Contraception Choices Contraception (birth control) is the use of any methods or devices to prevent pregnancy. Below are some methods to help avoid pregnancy. HORMONAL METHODS   Contraceptive implant. This is a thin, plastic tube containing progesterone hormone. It does not contain estrogen hormone. Your caregiver inserts the tube in the inner part of the upper arm. The tube can remain in place for up to 3 years. After 3 years, the implant must be removed. The implant prevents the ovaries from releasing an egg (ovulation), thickens the cervical mucus which prevents sperm from entering the uterus, and thins the lining of the inside of the uterus.   Progesterone-only injections. These injections are given every 3 months by your caregiver to prevent pregnancy. This synthetic progesterone hormone stops the ovaries from releasing eggs. It also thickens cervical mucus and changes the uterine lining. This makes it harder for sperm to survive in the uterus.   Birth control pills. These pills contain estrogen and progesterone hormone. They work by stopping the egg from forming in the ovary (ovulation). Birth control pills are prescribed by a caregiver.Birth control pills can also be used to treat heavy periods.   Minipill. This type of birth control pill contains only the progesterone hormone. They are taken every day of each month and must be prescribed by your  caregiver.   Birth control patch. The patch contains hormones similar to those in birth control pills. It must be changed once a week and is prescribed by a caregiver.   Vaginal ring. The ring contains hormones similar to those in birth control pills. It is left in the vagina for 3 weeks, removed for 1 week, and then a new one is put back in place. The patient must be comfortable inserting and removing the ring from the vagina.A caregiver's prescription is necessary.   Emergency contraception. Emergency contraceptives prevent pregnancy after unprotected sexual intercourse. This pill can be taken right after sex or up to 5 days after unprotected sex. It is most effective the sooner you take the pills after having sexual intercourse. Emergency contraceptive pills are available without a prescription. Check with your pharmacist. Do not use emergency contraception as your only form of birth control.  BARRIER METHODS   Female condom. This is a thin sheath (latex or rubber) that is worn over the penis during sexual intercourse. It can be used with spermicide to increase effectiveness.   Female condom. This is a soft, loose-fitting   sheath that is put into the vagina before sexual intercourse.   Diaphragm. This is a soft, latex, dome-shaped barrier that must be fitted by a caregiver. It is inserted into the vagina, along with a spermicidal jelly. It is inserted before intercourse. The diaphragm should be left in the vagina for 6 to 8 hours after intercourse.   Cervical cap. This is a round, soft, latex or plastic cup that fits over the cervix and must be fitted by a caregiver. The cap can be left in place for up to 48 hours after intercourse.   Sponge. This is a soft, circular piece of polyurethane foam. The sponge has spermicide in it. It is inserted into the vagina after wetting it and before sexual intercourse.   Spermicides. These are chemicals that kill or block sperm from entering the cervix and  uterus. They come in the form of creams, jellies, suppositories, foam, or tablets. They do not require a prescription. They are inserted into the vagina with an applicator before having sexual intercourse. The process must be repeated every time you have sexual intercourse.  INTRAUTERINE CONTRACEPTION  Intrauterine device (IUD). This is a T-shaped device that is put in a woman's uterus during a menstrual period to prevent pregnancy. There are 2 types:   Copper IUD. This type of IUD is wrapped in copper wire and is placed inside the uterus. Copper makes the uterus and fallopian tubes produce a fluid that kills sperm. It can stay in place for 10 years.   Hormone IUD. This type of IUD contains the hormone progestin (synthetic progesterone). The hormone thickens the cervical mucus and prevents sperm from entering the uterus, and it also thins the uterine lining to prevent implantation of a fertilized egg. The hormone can weaken or kill the sperm that get into the uterus. It can stay in place for 5 years.  PERMANENT METHODS OF CONTRACEPTION  Female tubal ligation. This is when the woman's fallopian tubes are surgically sealed, tied, or blocked to prevent the egg from traveling to the uterus.   Female sterilization. This is when the female has the tubes that carry sperm tied off (vasectomy).This blocks sperm from entering the vagina during sexual intercourse. After the procedure, the man can still ejaculate fluid (semen).  NATURAL PLANNING METHODS  Natural family planning. This is not having sexual intercourse or using a barrier method (condom, diaphragm, cervical cap) on days the woman could become pregnant.   Calendar method. This is keeping track of the length of each menstrual cycle and identifying when you are fertile.   Ovulation method. This is avoiding sexual intercourse during ovulation.   Symptothermal method. This is avoiding sexual intercourse during ovulation, using a thermometer and  ovulation symptoms.   Post-ovulation method. This is timing sexual intercourse after you have ovulated.  Regardless of which type or method of contraception you choose, it is important that you use condoms to protect against the transmission of sexually transmitted diseases (STDs). Talk with your caregiver about which form of contraception is most appropriate for you. Document Released: 02/08/2005 Document Revised: 01/28/2011 Document Reviewed: 06/17/2010 ExitCare Patient Information 2012 [employer]. Breastfeeding BENEFITS OF BREASTFEEDING For the baby  The first milk (colostrum) helps the baby's digestive system function better.   There are antibodies from the mother in the milk that help the baby fight off infections.   The baby has a lower incidence of asthma, allergies, and SIDS (sudden infant death syndrome).   The nutrients in breast milk are   better than formulas for the baby and helps the baby's brain grow better.   Babies who breastfeed have less gas, colic, and constipation.  For the mother  Breastfeeding helps develop a very special bond between mother and baby.   It is more convenient, always available at the correct temperature and cheaper than formula feeding.   It burns calories in the mother and helps with losing weight that was gained during pregnancy.   It makes the uterus contract back down to normal size faster and slows bleeding following delivery.   Breastfeeding mothers have a lower risk of developing breast cancer.  NURSE FREQUENTLY  A healthy, full-term baby may breastfeed as often as every hour or space his or her feedings to every 3 hours.   How often to nurse will vary from baby to baby. Watch your baby for signs of hunger, not the clock.   Nurse as often as the baby requests, or when you feel the need to reduce the fullness of your breasts.   Awaken the baby if it has been 3 to 4 hours since the last feeding.   Frequent feeding will help the  mother make more milk and will prevent problems like sore nipples and engorgement of the breasts.  BABY'S POSITION AT THE BREAST  Whether lying down or sitting, be sure that the baby's tummy is facing your tummy.   Support the breast with 4 fingers underneath the breast and the thumb above. Make sure your fingers are well away from the nipple and baby's mouth.   Stroke the baby's lips and cheek closest to the breast gently with your finger or nipple.   When the baby's mouth is open wide enough, place all of your nipple and as much of the dark area around the nipple as possible into your baby's mouth.   Pull the baby in close so the tip of the nose and the baby's cheeks touch the breast during the feeding.  FEEDINGS  The length of each feeding varies from baby to baby and from feeding to feeding.   The baby must suck about 2 to 3 minutes for your milk to get to him or her. This is called a "let down." For this reason, allow the baby to feed on each breast as long as he or she wants. Your baby will end the feeding when he or she has received the right balance of nutrients.   To break the suction, put your finger into the corner of the baby's mouth and slide it between his or her gums before removing your breast from his or her mouth. This will help prevent sore nipples.  REDUCING BREAST ENGORGEMENT  In the first week after your baby is born, you may experience signs of breast engorgement. When breasts are engorged, they feel heavy, warm, full, and may be tender to the touch. You can reduce engorgement if you:   Nurse frequently, every 2 to 3 hours. Mothers who breastfeed early and often have fewer problems with engorgement.   Place light ice packs on your breasts between feedings. This reduces swelling. Wrap the ice packs in a lightweight towel to protect your skin.   Apply moist hot packs to your breast for 5 to 10 minutes before each feeding. This increases circulation and helps the milk  flow.   Gently massage your breast before and during the feeding.   Make sure that the baby empties at least one breast at every feeding before switching sides.     Use a breast pump to empty the breasts if your baby is sleepy or not nursing well. You may also want to pump if you are returning to work or or you feel you are getting engorged.   Avoid bottle feeds, pacifiers or supplemental feedings of water or juice in place of breastfeeding.   Be sure the baby is latched on and positioned properly while breastfeeding.   Prevent fatigue, stress, and anemia.   Wear a supportive bra, avoiding underwire styles.   Eat a balanced diet with enough fluids.  If you follow these suggestions, your engorgement should improve in 24 to 48 hours. If you are still experiencing difficulty, call your lactation consultant or caregiver. IS MY BABY GETTING ENOUGH MILK? Sometimes, mothers worry about whether their babies are getting enough milk. You can be assured that your baby is getting enough milk if:  The baby is actively sucking and you hear swallowing.   The baby nurses at least 8 to 12 times in a 24 hour time period. Nurse your baby until he or she unlatches or falls asleep at the first breast (at least 10 to 20 minutes), then offer the second side.   The baby is wetting 5 to 6 disposable diapers (6 to 8 cloth diapers) in a 24 hour period by 5 to 6 days of age.   The baby is having at least 2 to 3 stools every 24 hours for the first few months. Breast milk is all the food your baby needs. It is not necessary for your baby to have water or formula. In fact, to help your breasts make more milk, it is best not to give your baby supplemental feedings during the early weeks.   The stool should be soft and yellow.   The baby should gain 4 to 7 ounces per week after he is 4 days old.  TAKE CARE OF YOURSELF Take care of your breasts by:  Bathing or showering daily.   Avoiding the use of soaps on your  nipples.   Start feedings on your left breast at one feeding and on your right breast at the next feeding.   You will notice an increase in your milk supply 2 to 5 days after delivery. You may feel some discomfort from engorgement, which makes your breasts very firm and often tender. Engorgement "peaks" out within 24 to 48 hours. In the meantime, apply warm moist towels to your breasts for 5 to 10 minutes before feeding. Gentle massage and expression of some milk before feeding will soften your breasts, making it easier for your baby to latch on. Wear a well fitting nursing bra and air dry your nipples for 10 to 15 minutes after each feeding.   Only use cotton bra pads.   Only use pure lanolin on your nipples after nursing. You do not need to wash it off before nursing.  Take care of yourself by:   Eating well-balanced meals and nutritious snacks.   Drinking milk, fruit juice, and water to satisfy your thirst (about 8 glasses a day).   Getting plenty of rest.   Increasing calcium in your diet (1200 mg a day).   Avoiding foods that you notice affect the baby in a bad way.  SEEK MEDICAL CARE IF:   You have any questions or difficulty with breastfeeding.   You need help.   You have a hard, red, sore area on your breast, accompanied by a fever of 100.5 F (38.1 C) or more.     Your baby is too sleepy to eat well or is having trouble sleeping.   Your baby is wetting less than 6 diapers per day, by 5 days of age.   Your baby's skin or white part of his or her eyes is more yellow than it was in the hospital.   You feel depressed.  Document Released: 02/08/2005 Document Revised: 01/28/2011 Document Reviewed: 09/23/2008 Exit[employer] Patient Information 2012 [employer]. 

## 2011-05-12 LAB — CBC
HCT: 37.2 % (ref 36.0–46.0)
MCHC: 33.9 g/dL (ref 30.0–36.0)
MCV: 95.1 fL (ref 78.0–100.0)
RDW: 13.9 % (ref 11.5–15.5)
WBC: 12.3 10*3/uL — ABNORMAL HIGH (ref 4.0–10.5)

## 2011-05-13 ENCOUNTER — Other Ambulatory Visit: Payer: Medicaid Other

## 2011-05-14 LAB — CREATININE CLEARANCE, URINE, 24 HOUR
Creatinine Clearance: 211 mL/min — ABNORMAL HIGH (ref 75–115)
Creatinine, 24H Ur: 1491 mg/d (ref 700–1800)
Creatinine, Urine: 49.7 mg/dL
Creatinine: 0.49 mg/dL — ABNORMAL LOW (ref 0.50–1.10)

## 2011-05-18 ENCOUNTER — Ambulatory Visit (INDEPENDENT_AMBULATORY_CARE_PROVIDER_SITE_OTHER): Payer: Medicaid Other | Admitting: Obstetrics and Gynecology

## 2011-05-18 VITALS — BP 126/91 | Wt 268.0 lb

## 2011-05-18 DIAGNOSIS — O09299 Supervision of pregnancy with other poor reproductive or obstetric history, unspecified trimester: Secondary | ICD-10-CM

## 2011-05-18 DIAGNOSIS — Z3480 Encounter for supervision of other normal pregnancy, unspecified trimester: Secondary | ICD-10-CM

## 2011-05-18 DIAGNOSIS — E669 Obesity, unspecified: Secondary | ICD-10-CM

## 2011-05-18 DIAGNOSIS — O99210 Obesity complicating pregnancy, unspecified trimester: Secondary | ICD-10-CM

## 2011-05-18 NOTE — Progress Notes (Signed)
Patient is having increased discharge that is just clear in color.  She is still having headaches and swelling.  She did 24 hour urine and labs last week.

## 2011-05-18 NOTE — Progress Notes (Signed)
Patient reports persistent headaches, unchanged since last visit. Si/sx of preeclampsia reviewed. FM/Labor precautions reviewed as well. Cultures done today Cx 1/30/high/soft

## 2011-05-21 LAB — CULTURE, BETA STREP (GROUP B ONLY)

## 2011-05-23 ENCOUNTER — Encounter (HOSPITAL_COMMUNITY): Payer: Self-pay | Admitting: Obstetrics and Gynecology

## 2011-05-23 ENCOUNTER — Inpatient Hospital Stay (HOSPITAL_COMMUNITY)
Admission: AD | Admit: 2011-05-23 | Discharge: 2011-05-23 | Disposition: A | Discharge status: Home / Self Care | Payer: Medicaid Other | Source: Ambulatory Visit | Attending: Obstetrics & Gynecology | Admitting: Obstetrics & Gynecology

## 2011-05-23 DIAGNOSIS — R51 Headache: Secondary | ICD-10-CM | POA: Insufficient documentation

## 2011-05-23 DIAGNOSIS — R030 Elevated blood-pressure reading, without diagnosis of hypertension: Secondary | ICD-10-CM | POA: Insufficient documentation

## 2011-05-23 DIAGNOSIS — O09299 Supervision of pregnancy with other poor reproductive or obstetric history, unspecified trimester: Secondary | ICD-10-CM

## 2011-05-23 DIAGNOSIS — O99891 Other specified diseases and conditions complicating pregnancy: Secondary | ICD-10-CM | POA: Insufficient documentation

## 2011-05-23 LAB — CBC
Platelets: 193 10*3/uL (ref 150–400)
RBC: 3.84 MIL/uL — ABNORMAL LOW (ref 3.87–5.11)
RDW: 13.8 % (ref 11.5–15.5)
WBC: 12.6 10*3/uL — ABNORMAL HIGH (ref 4.0–10.5)

## 2011-05-23 LAB — URINALYSIS, ROUTINE W REFLEX MICROSCOPIC
Glucose, UA: NEGATIVE mg/dL
Hgb urine dipstick: NEGATIVE
Leukocytes, UA: NEGATIVE
Specific Gravity, Urine: >1.03 — ABNORMAL HIGH (ref 1.005–1.030)
pH: 6 (ref 5.0–8.0)

## 2011-05-23 LAB — COMPREHENSIVE METABOLIC PANEL
ALT: 10 U/L (ref 0–35)
AST: 17 U/L (ref 0–37)
Albumin: 2.7 g/dL — ABNORMAL LOW (ref 3.5–5.2)
CO2: 19 mEq/L (ref 19–32)
Chloride: 103 mEq/L (ref 96–112)
GFR calc non Af Amer: >90 mL/min (ref >90)
Sodium: 134 mEq/L — ABNORMAL LOW (ref 135–145)
Total Bilirubin: 0.7 mg/dL (ref 0.3–1.2)

## 2011-05-23 LAB — PROTEIN / CREATININE RATIO, URINE
Creatinine, Urine: 187.65 mg/dL
Protein Creatinine Ratio: 0.05 (ref 0.00–0.15)
Total Protein, Urine: 10.1 mg/dL

## 2011-05-23 NOTE — MAU Provider Note (Signed)
Labs all WNL. BP's WNL if protein creat ratio WNL willl d/c home.

## 2011-05-23 NOTE — MAU Provider Note (Signed)
History     CSN: [id number]  Arrival date and time: 05/23/11 1755   First Provider Initiated Contact with Patient 05/23/11 1840      Chief Complaint  Patient presents with  . Headache  . Hypertension   HPI HA for a few days, did not improve with Tylenol. Swelling in hands since last night (new onset.) 24 hour urine "borderline" at MD office on Tuesday. BP at home was 150's systolic so came in for evaluation. Some blurred vision off and on; no black dots or wavy line. History of induction of vaginal delivery at 36 weeks due to preeclampsia.  No vaginal bleeding, no vaginal discharge, no strong regular contractions, good fetal movement.   Prenatal care at [facility]. Last visit last week. No known complications of pregnancy so far.   OB History    Grav Para Term Preterm Abortions TAB SAB Ect Mult Living   2 1 1  0 0 0 0 0 0 1      Past Medical History  Diagnosis Date  . Abnormal Pap smear 2007  . Pregnancy induced hypertension 2008    preclampsia  . Depression     Past Surgical History  Procedure Date  . Wisdom tooth extraction 2011     x 4    Family History  Problem Relation Age of Onset  . Hypertension Mother   . Depression Mother   . Hypertension Father   . Bipolar disorder Father   . Diabetes Maternal Grandmother   . Asthma Maternal Grandmother   . Heart disease Maternal Grandmother     CHF  . Heart disease Maternal Grandfather     CHF    History  Substance Use Topics  . Smoking status: Current Some Day Smoker -- 0.2 packs/day    Types: Cigarettes  . Smokeless tobacco: Never Used  . Alcohol Use: No    Allergies: No Known Allergies  Prescriptions prior to admission  Medication Sig Dispense Refill  . acetaminophen (TYLENOL) 325 MG tablet Take 650 mg by mouth every 6 (six) hours as needed. For pain       . hydrOXYzine (ATARAX/VISTARIL) 25 MG tablet Take 25 mg by mouth every 6 (six) hours as needed.      . Prenatal Vit-Fe Fumarate-FA  (MULTIVITAMIN-PRENATAL) 27-0.8 MG TABS Take 1 tablet by mouth daily.         Review of Systems  Constitutional: Negative for fever and chills.  Cardiovascular: Positive for leg swelling. Negative for chest pain.  Gastrointestinal: Positive for nausea. Negative for vomiting and abdominal pain.  Genitourinary: Negative for dysuria.  Musculoskeletal: Positive for back pain.  Skin: Negative for rash.  Neurological: Positive for headaches. Negative for dizziness.  Psychiatric/Behavioral: Negative for depression.   Physical Exam   Blood pressure 132/80, pulse 93, temperature 97 F (36.1 C), temperature source Oral, resp. rate 24, height 5\' 7"  (1.702 m), weight 124.104 kg (273 lb 9.6 oz), last menstrual period 09/04/2010.  Physical Exam  Constitutional: She is oriented to person, place, and time. She appears well-developed and well-nourished. No distress.  HENT:  Head: Normocephalic and atraumatic.  Neck: Normal range of motion.  Cardiovascular: Normal rate.   Respiratory: Effort normal.  GI: Soft. There is no tenderness.       Gravid. Toco in place.  Musculoskeletal: Normal range of motion. She exhibits no edema and no tenderness.  Neurological: She is alert and oriented to person, place, and time. No cranial nerve deficit.  Skin: Skin is dry. No  rash noted.    MAU Course  Procedures Results for orders placed during the hospital encounter of 05/23/11 (from the past 24 hour(s))  URINALYSIS, ROUTINE W REFLEX MICROSCOPIC     Status: Abnormal   Collection Time   05/23/11  6:05 PM      Component Value Range   Color, Urine YELLOW  YELLOW    APPearance HAZY (*) CLEAR    Specific Gravity, Urine >1.030 (*) 1.005 - 1.030    pH 6.0  5.0 - 8.0    Glucose, UA NEGATIVE  NEGATIVE (mg/dL)   Hgb urine dipstick NEGATIVE  NEGATIVE    Bilirubin Urine NEGATIVE  NEGATIVE    Ketones, ur NEGATIVE  NEGATIVE (mg/dL)   Protein, ur NEGATIVE  NEGATIVE (mg/dL)   Urobilinogen, UA 0.2  0.0 - 1.0 (mg/dL)    Nitrite NEGATIVE  NEGATIVE    Leukocytes, UA NEGATIVE  NEGATIVE    MDM Patient comfortable. BP range from 120-130's systolic No protein in urine. Pr:Cr ratio sent to lab.  CBC and CMet pending.  Assessment and Plan  26 yo G2P1001 at [redacted]w[redacted]d presenting for elevated blood pressures - Patient in observation. Will continue to monitor blood pressures. Awaiting lab results - Plan discussed with Zerita Boers, CNM who will assume care at [facility] 05/23/2011, 6:40 PM

## 2011-05-23 NOTE — MAU Note (Signed)
Pt reports she has been having headaches and b/p is elvated at 150-160's/90's. Pt c/o of blurred vision and swelling. Has had 24 hr urine that was "boarderline"

## 2011-05-23 NOTE — Discharge Instructions (Signed)
Call your doctor with any concerns or questions.

## 2011-05-25 ENCOUNTER — Ambulatory Visit (INDEPENDENT_AMBULATORY_CARE_PROVIDER_SITE_OTHER): Payer: Medicaid Other | Admitting: Obstetrics & Gynecology

## 2011-05-25 DIAGNOSIS — Z3480 Encounter for supervision of other normal pregnancy, unspecified trimester: Secondary | ICD-10-CM

## 2011-05-25 NOTE — Progress Notes (Signed)
Went to [facility] over weekend due to headache. PIH ruled out, nl labs. PIH precautions given.

## 2011-05-25 NOTE — Patient Instructions (Signed)
Preeclampsia and Eclampsia  Preeclampsia is a condition of high blood pressure during pregnancy. It can happen at 20 weeks or later in pregnancy. If high blood pressure occurs in the second half of pregnancy with no other symptoms, it is called gestational hypertension and goes away after the baby is born. If any of the symptoms listed below develop with gestational hypertension, it is then called preeclampsia. Eclampsia (convulsions) may follow preeclampsia. This is one of the reasons for regular prenatal checkups. Early diagnosis and treatment are very important to prevent eclampsia.  CAUSES   There is no known cause of preeclampsia/eclampsia in pregnancy. There are several known conditions that may put the pregnant woman at risk, such as:   The first pregnancy.   Having preeclampsia in a past pregnancy.   Having lasting (chronic) high blood pressure.   Having multiples (twins, triplets).   Being age 35 or older.   African American ethnic background.   Having kidney disease or diabetes.   Medical conditions such as lupus or blood diseases.   Being overweight (obese).  SYMPTOMS    High blood pressure.   Headaches.   Sudden weight gain.   Swelling of hands, face, legs, and feet.   Protein in the urine.   Feeling sick to your stomach (nauseous) and throwing up (vomiting).   Vision problems (blurred or double vision).   Numbness in the face, arms, legs, and feet.   Dizziness.   Slurred speech.   Preeclampsia can cause growth retardation in the fetus.   Separation (abruption) of the placenta.   Not enough fluid in the amniotic sac (oligohydramnios).   Sensitivity to bright lights.   Belly (abdominal) pain.  DIAGNOSIS   If protein is found in the urine in the second half of pregnancy, this is considered preeclampsia. Other symptoms mentioned above may also be present.  TREATMENT   It is necessary to treat this.   Your caregiver may prescribe bed rest early in this condition. Plenty of rest and  salt restriction may be all that is needed.   Medicines may be necessary to lower blood pressure if the condition does not respond to more conservative measures.   In more severe cases, hospitalization may be needed:   For treatment of blood pressure.   To control fluid retention.   To monitor the baby to see if the condition is causing harm to the baby.   Hospitalization is the best way to treat the first sign of preeclampsia. This is so the mother and baby can be watched closely and blood tests can be done effectively and correctly.   If the condition becomes severe, it may be necessary to induce labor or to remove the infant by surgical means (cesarean section). The best cure for preeclampsia/eclampsia is to deliver the baby.  Preeclampsia and eclampsia involve risks to mother and infant. Your caregiver will discuss these risks with you. Together, you can work out the best possible approach to your problems. Make sure you keep your prenatal visits as scheduled. Not keeping appointments could result in a chronic or permanent injury, pain, disability to you, and death or injury to you or your unborn baby. If there is any problem keeping the appointment, you must call to reschedule.  HOME CARE INSTRUCTIONS    Keep your prenatal appointments and tests as scheduled.   Tell your caregiver if you have any of the above risk factors.   Get plenty of rest and sleep.   Eat a balanced   diet that is low in salt, and do not add salt to your food.   Avoid stressful situations.   Only take over-the-counter and prescriptions medicines for pain, discomfort, or fever as directed by your caregiver.  SEEK IMMEDIATE MEDICAL CARE IF:    You develop severe swelling anywhere in the body. This usually occurs in the legs.   You gain 5 lb/2.3 kg or more in a week.   You develop a severe headache, dizziness, problems with your vision, or confusion.   You have abdominal pain, nausea, or vomiting.   You have a seizure.   You  have trouble moving any part of your body, or you develop numbness or problems speaking.   You have bruising or abnormal bleeding from anywhere in the body.   You develop a stiff neck.   You pass out.  MAKE SURE YOU:    Understand these instructions.   Will watch your condition.   Will get help right away if you are not doing well or get worse.  Document Released: 02/06/2000 Document Revised: 01/28/2011 Document Reviewed: 09/22/2007  [employer] Patient Information 2012 [employer].

## 2011-06-01 ENCOUNTER — Ambulatory Visit (INDEPENDENT_AMBULATORY_CARE_PROVIDER_SITE_OTHER): Payer: Medicaid Other | Admitting: Family Medicine

## 2011-06-01 DIAGNOSIS — Z3480 Encounter for supervision of other normal pregnancy, unspecified trimester: Secondary | ICD-10-CM

## 2011-06-01 NOTE — Patient Instructions (Addendum)
Normal Labor and Delivery Your caregiver must first be sure you are in labor. Signs of labor include:  You may pass what is called "the mucus plug" before labor begins. This is a small amount of blood stained mucus.   Regular uterine contractions.   The time between contractions get closer together.   The discomfort and pain gradually gets more intense.   Pains are mostly located in the back.   Pains get worse when walking.   The cervix (the opening of the uterus becomes thinner (begins to efface) and opens up (dilates).  Once you are in labor and admitted into the hospital or care center, your caregiver will do the following:  A complete physical examination.   Check your vital signs (blood pressure, pulse, temperature and the fetal heart rate).   Do a vaginal examination (using a sterile glove and lubricant) to determine:   The position (presentation) of the baby (head [vertex] or buttock first).   The level (station) of the baby's head in the birth canal.   The effacement and dilatation of the cervix.   You may have your pubic hair shaved and be given an enema depending on your caregiver and the circumstance.   An electronic monitor is usually placed on your abdomen. The monitor follows the length and intensity of the contractions, as well as the baby's heart rate.   Usually, your caregiver will insert an IV in your arm with a bottle of sugar water. This is done as a precaution so that medications can be given to you quickly during labor or delivery.  NORMAL LABOR AND DELIVERY IS DIVIDED UP INTO 3 STAGES: First Stage This is when regular contractions begin and the cervix begins to efface and dilate. This stage can last from 3 to 15 hours. The end of the first stage is when the cervix is 100% effaced and 10 centimeters dilated. Pain medications may be given by   Injection (morphine, demerol, etc.)   Regional anesthesia (spinal, caudal or epidural, anesthetics given in  different locations of the spine). Paracervical pain medication may be given, which is an injection of and anesthetic on each side of the cervix.  A pregnant woman may request to have "Natural Childbirth" which is not to have any medications or anesthesia during her labor and delivery. Second Stage This is when the baby comes down through the birth canal (vagina) and is born. This can take 1 to 4 hours. As the baby's head comes down through the birth canal, you may feel like you are going to have a bowel movement. You will get the urge to bear down and push until the baby is delivered. As the baby's head is being delivered, the caregiver will decide if an episiotomy (a cut in the perineum and vagina area) is needed to prevent tearing of the tissue in this area. The episiotomy is sewn up after the delivery of the baby and placenta. Sometimes a mask with nitrous oxide is given for the mother to breath during the delivery of the baby to help if there is too much pain. The end of Stage 2 is when the baby is fully delivered. Then when the umbilical cord stops pulsating it is clamped and cut. Third Stage The third stage begins after the baby is completely delivered and ends after the placenta (afterbirth) is delivered. This usually takes 5 to 30 minutes. After the placenta is delivered, a medication is given either by intravenous or injection to help contract   the uterus and prevent bleeding. The third stage is not painful and pain medication is usually not necessary. If an episiotomy was done, it is repaired at this time. After the delivery, the mother is watched and monitored closely for 1 to 2 hours to make sure there is no postpartum bleeding (hemorrhage). If there is a lot of bleeding, medication is given to contract the uterus and stop the bleeding. Document Released: 11/18/2007 Document Revised: 01/28/2011 Document Reviewed: 11/18/2007 [employer] Patient Information 2012 [employer]. Contraception  Choices Contraception (birth control) is the use of any methods or devices to prevent pregnancy. Below are some methods to help avoid pregnancy. HORMONAL METHODS   Contraceptive implant. This is a thin, plastic tube containing progesterone hormone. It does not contain estrogen hormone. Your caregiver inserts the tube in the inner part of the upper arm. The tube can remain in place for up to 3 years. After 3 years, the implant must be removed. The implant prevents the ovaries from releasing an egg (ovulation), thickens the cervical mucus which prevents sperm from entering the uterus, and thins the lining of the inside of the uterus.   Progesterone-only injections. These injections are given every 3 months by your caregiver to prevent pregnancy. This synthetic progesterone hormone stops the ovaries from releasing eggs. It also thickens cervical mucus and changes the uterine lining. This makes it harder for sperm to survive in the uterus.   Birth control pills. These pills contain estrogen and progesterone hormone. They work by stopping the egg from forming in the ovary (ovulation). Birth control pills are prescribed by a caregiver.Birth control pills can also be used to treat heavy periods.   Minipill. This type of birth control pill contains only the progesterone hormone. They are taken every day of each month and must be prescribed by your caregiver.   Birth control patch. The patch contains hormones similar to those in birth control pills. It must be changed once a week and is prescribed by a caregiver.   Vaginal ring. The ring contains hormones similar to those in birth control pills. It is left in the vagina for 3 weeks, removed for 1 week, and then a new one is put back in place. The patient must be comfortable inserting and removing the ring from the vagina.A caregiver's prescription is necessary.   Emergency contraception. Emergency contraceptives prevent pregnancy after unprotected sexual  intercourse. This pill can be taken right after sex or up to 5 days after unprotected sex. It is most effective the sooner you take the pills after having sexual intercourse. Emergency contraceptive pills are available without a prescription. Check with your pharmacist. Do not use emergency contraception as your only form of birth control.  BARRIER METHODS   Female condom. This is a thin sheath (latex or rubber) that is worn over the penis during sexual intercourse. It can be used with spermicide to increase effectiveness.   Female condom. This is a soft, loose-fitting sheath that is put into the vagina before sexual intercourse.   Diaphragm. This is a soft, latex, dome-shaped barrier that must be fitted by a caregiver. It is inserted into the vagina, along with a spermicidal jelly. It is inserted before intercourse. The diaphragm should be left in the vagina for 6 to 8 hours after intercourse.   Cervical cap. This is a round, soft, latex or plastic cup that fits over the cervix and must be fitted by a caregiver. The cap can be left in place for up to   48 hours after intercourse.   Sponge. This is a soft, circular piece of polyurethane foam. The sponge has spermicide in it. It is inserted into the vagina after wetting it and before sexual intercourse.   Spermicides. These are chemicals that kill or block sperm from entering the cervix and uterus. They come in the form of creams, jellies, suppositories, foam, or tablets. They do not require a prescription. They are inserted into the vagina with an applicator before having sexual intercourse. The process must be repeated every time you have sexual intercourse.  INTRAUTERINE CONTRACEPTION  Intrauterine device (IUD). This is a T-shaped device that is put in a woman's uterus during a menstrual period to prevent pregnancy. There are 2 types:   Copper IUD. This type of IUD is wrapped in copper wire and is placed inside the uterus. Copper makes the uterus and  fallopian tubes produce a fluid that kills sperm. It can stay in place for 10 years.   Hormone IUD. This type of IUD contains the hormone progestin (synthetic progesterone). The hormone thickens the cervical mucus and prevents sperm from entering the uterus, and it also thins the uterine lining to prevent implantation of a fertilized egg. The hormone can weaken or kill the sperm that get into the uterus. It can stay in place for 5 years.  PERMANENT METHODS OF CONTRACEPTION  Female tubal ligation. This is when the woman's fallopian tubes are surgically sealed, tied, or blocked to prevent the egg from traveling to the uterus.   Female sterilization. This is when the female has the tubes that carry sperm tied off (vasectomy).This blocks sperm from entering the vagina during sexual intercourse. After the procedure, the man can still ejaculate fluid (semen).  NATURAL PLANNING METHODS  Natural family planning. This is not having sexual intercourse or using a barrier method (condom, diaphragm, cervical cap) on days the woman could become pregnant.   Calendar method. This is keeping track of the length of each menstrual cycle and identifying when you are fertile.   Ovulation method. This is avoiding sexual intercourse during ovulation.   Symptothermal method. This is avoiding sexual intercourse during ovulation, using a thermometer and ovulation symptoms.   Post-ovulation method. This is timing sexual intercourse after you have ovulated.  Regardless of which type or method of contraception you choose, it is important that you use condoms to protect against the transmission of sexually transmitted diseases (STDs). Talk with your caregiver about which form of contraception is most appropriate for you. Document Released: 02/08/2005 Document Revised: 01/28/2011 Document Reviewed: 06/17/2010 [employer] Patient Information 2012 [employer]. Breastfeeding BENEFITS OF BREASTFEEDING For the baby  The first  milk (colostrum) helps the baby's digestive system function better.   There are antibodies from the mother in the milk that help the baby fight off infections.   The baby has a lower incidence of asthma, allergies, and SIDS (sudden infant death syndrome).   The nutrients in breast milk are better than formulas for the baby and helps the baby's brain grow better.   Babies who breastfeed have less gas, colic, and constipation.  For the mother  Breastfeeding helps develop a very special bond between mother and baby.   It is more convenient, always available at the correct temperature and cheaper than formula feeding.   It burns calories in the mother and helps with losing weight that was gained during pregnancy.   It makes the uterus contract back down to normal size faster and slows bleeding following delivery.     Breastfeeding mothers have a lower risk of developing breast cancer.  NURSE FREQUENTLY  A healthy, full-term baby may breastfeed as often as every hour or space his or her feedings to every 3 hours.   How often to nurse will vary from baby to baby. Watch your baby for signs of hunger, not the clock.   Nurse as often as the baby requests, or when you feel the need to reduce the fullness of your breasts.   Awaken the baby if it has been 3 to 4 hours since the last feeding.   Frequent feeding will help the mother make more milk and will prevent problems like sore nipples and engorgement of the breasts.  BABY'S POSITION AT THE BREAST  Whether lying down or sitting, be sure that the baby's tummy is facing your tummy.   Support the breast with 4 fingers underneath the breast and the thumb above. Make sure your fingers are well away from the nipple and baby's mouth.   Stroke the baby's lips and cheek closest to the breast gently with your finger or nipple.   When the baby's mouth is open wide enough, place all of your nipple and as much of the dark area around the nipple as  possible into your baby's mouth.   Pull the baby in close so the tip of the nose and the baby's cheeks touch the breast during the feeding.  FEEDINGS  The length of each feeding varies from baby to baby and from feeding to feeding.   The baby must suck about 2 to 3 minutes for your milk to get to him or her. This is called a "let down." For this reason, allow the baby to feed on each breast as long as he or she wants. Your baby will end the feeding when he or she has received the right balance of nutrients.   To break the suction, put your finger into the corner of the baby's mouth and slide it between his or her gums before removing your breast from his or her mouth. This will help prevent sore nipples.  REDUCING BREAST ENGORGEMENT  In the first week after your baby is born, you may experience signs of breast engorgement. When breasts are engorged, they feel heavy, warm, full, and may be tender to the touch. You can reduce engorgement if you:   Nurse frequently, every 2 to 3 hours. Mothers who breastfeed early and often have fewer problems with engorgement.   Place light ice packs on your breasts between feedings. This reduces swelling. Wrap the ice packs in a lightweight towel to protect your skin.   Apply moist hot packs to your breast for 5 to 10 minutes before each feeding. This increases circulation and helps the milk flow.   Gently massage your breast before and during the feeding.   Make sure that the baby empties at least one breast at every feeding before switching sides.   Use a breast pump to empty the breasts if your baby is sleepy or not nursing well. You may also want to pump if you are returning to work or or you feel you are getting engorged.   Avoid bottle feeds, pacifiers or supplemental feedings of water or juice in place of breastfeeding.   Be sure the baby is latched on and positioned properly while breastfeeding.   Prevent fatigue, stress, and anemia.   Wear a  supportive bra, avoiding underwire styles.   Eat a balanced diet with enough fluids.    If you follow these suggestions, your engorgement should improve in 24 to 48 hours. If you are still experiencing difficulty, call your lactation consultant or caregiver. IS MY BABY GETTING ENOUGH MILK? Sometimes, mothers worry about whether their babies are getting enough milk. You can be assured that your baby is getting enough milk if:  The baby is actively sucking and you hear swallowing.   The baby nurses at least 8 to 12 times in a 24 hour time period. Nurse your baby until he or she unlatches or falls asleep at the first breast (at least 10 to 20 minutes), then offer the second side.   The baby is wetting 5 to 6 disposable diapers (6 to 8 cloth diapers) in a 24 hour period by 5 to 6 days of age.   The baby is having at least 2 to 3 stools every 24 hours for the first few months. Breast milk is all the food your baby needs. It is not necessary for your baby to have water or formula. In fact, to help your breasts make more milk, it is best not to give your baby supplemental feedings during the early weeks.   The stool should be soft and yellow.   The baby should gain 4 to 7 ounces per week after he is 4 days old.  TAKE CARE OF YOURSELF Take care of your breasts by:  Bathing or showering daily.   Avoiding the use of soaps on your nipples.   Start feedings on your left breast at one feeding and on your right breast at the next feeding.   You will notice an increase in your milk supply 2 to 5 days after delivery. You may feel some discomfort from engorgement, which makes your breasts very firm and often tender. Engorgement "peaks" out within 24 to 48 hours. In the meantime, apply warm moist towels to your breasts for 5 to 10 minutes before feeding. Gentle massage and expression of some milk before feeding will soften your breasts, making it easier for your baby to latch on. Wear a well fitting nursing  bra and air dry your nipples for 10 to 15 minutes after each feeding.   Only use cotton bra pads.   Only use pure lanolin on your nipples after nursing. You do not need to wash it off before nursing.  Take care of yourself by:   Eating well-balanced meals and nutritious snacks.   Drinking milk, fruit juice, and water to satisfy your thirst (about 8 glasses a day).   Getting plenty of rest.   Increasing calcium in your diet (1200 mg a day).   Avoiding foods that you notice affect the baby in a bad way.  SEEK MEDICAL CARE IF:   You have any questions or difficulty with breastfeeding.   You need help.   You have a hard, red, sore area on your breast, accompanied by a fever of 100.5 F (38.1 C) or more.   Your baby is too sleepy to eat well or is having trouble sleeping.   Your baby is wetting less than 6 diapers per day, by 5 days of age.   Your baby's skin or white part of his or her eyes is more yellow than it was in the hospital.   You feel depressed.  Document Released: 02/08/2005 Document Revised: 01/28/2011 Document Reviewed: 09/23/2008 [employer] Patient Information 2012 [employer]. 

## 2011-06-01 NOTE — Progress Notes (Signed)
Membranes stripped  

## 2011-06-02 ENCOUNTER — Encounter (HOSPITAL_COMMUNITY): Payer: Self-pay | Admitting: *Deleted

## 2011-06-02 ENCOUNTER — Inpatient Hospital Stay (HOSPITAL_COMMUNITY)
Admission: AD | Admit: 2011-06-02 | Discharge: 2011-06-05 | DRG: 775 | Disposition: A | Discharge status: Home / Self Care | Payer: Medicaid Other | Source: Ambulatory Visit | Attending: Family Medicine | Admitting: Family Medicine

## 2011-06-02 ENCOUNTER — Encounter (HOSPITAL_COMMUNITY): Payer: Self-pay | Admitting: Anesthesiology

## 2011-06-02 ENCOUNTER — Inpatient Hospital Stay (HOSPITAL_COMMUNITY): Payer: Medicaid Other | Admitting: Anesthesiology

## 2011-06-02 DIAGNOSIS — O09299 Supervision of pregnancy with other poor reproductive or obstetric history, unspecified trimester: Secondary | ICD-10-CM

## 2011-06-02 DIAGNOSIS — IMO0002 Reserved for concepts with insufficient information to code with codable children: Secondary | ICD-10-CM

## 2011-06-02 LAB — COMPREHENSIVE METABOLIC PANEL
AST: 19 U/L (ref 0–37)
Albumin: 2.9 g/dL — ABNORMAL LOW (ref 3.5–5.2)
Chloride: 101 mEq/L (ref 96–112)
Creatinine, Ser: 0.48 mg/dL — ABNORMAL LOW (ref 0.50–1.10)
Potassium: 3.6 mEq/L (ref 3.5–5.1)
Total Bilirubin: 1 mg/dL (ref 0.3–1.2)
Total Protein: 6.1 g/dL (ref 6.0–8.3)

## 2011-06-02 LAB — CBC
HCT: 35.7 % — ABNORMAL LOW (ref 36.0–46.0)
Hemoglobin: 12.4 g/dL (ref 12.0–15.0)
MCH: 32.5 pg (ref 26.0–34.0)
RBC: 3.81 MIL/uL — ABNORMAL LOW (ref 3.87–5.11)

## 2011-06-02 MED ORDER — LACTATED RINGERS IV SOLN
500.0000 mL | INTRAVENOUS | Status: DC | PRN
Start: 2011-06-02 — End: 2011-06-03

## 2011-06-02 MED ORDER — LACTATED RINGERS IV SOLN
500.0000 mL | Freq: Once | INTRAVENOUS | Status: AC
  Administered 2011-06-02: 500 mL via INTRAVENOUS

## 2011-06-02 MED ORDER — LACTATED RINGERS IV SOLN
INTRAVENOUS | Status: DC
  Administered 2011-06-02: 125 mL/h via INTRAVENOUS
  Administered 2011-06-02: 22:00:00 via INTRAVENOUS

## 2011-06-02 MED ORDER — DIPHENHYDRAMINE HCL 50 MG/ML IJ SOLN: 12.5000 mg | INTRAMUSCULAR | Status: DC | PRN

## 2011-06-02 MED ORDER — LIDOCAINE HCL (PF) 1 % IJ SOLN
INTRAMUSCULAR | Status: DC | PRN
  Administered 2011-06-02 (×2): 4 mL

## 2011-06-02 MED ORDER — OXYTOCIN BOLUS FROM INFUSION
500.0000 mL | Freq: Once | INTRAVENOUS | Status: AC
  Administered 2011-06-03: 500 mL via INTRAVENOUS
  Filled 2011-06-02: qty 500

## 2011-06-02 MED ORDER — OXYTOCIN 20 UNITS IN LACTATED RINGERS INFUSION - SIMPLE
1.0000 m[IU]/min | INTRAVENOUS | Status: DC
  Administered 2011-06-02: 2 m[IU]/min via INTRAVENOUS
  Filled 2011-06-02: qty 1000

## 2011-06-02 MED ORDER — ONDANSETRON HCL 4 MG/2ML IJ SOLN: 4.0000 mg | Freq: Four times a day (QID) | INTRAMUSCULAR | Status: DC | PRN

## 2011-06-02 MED ORDER — EPHEDRINE 5 MG/ML INJ: 10.0000 mg | INTRAVENOUS | Status: DC | PRN

## 2011-06-02 MED ORDER — FENTANYL 2.5 MCG/ML BUPIVACAINE 1/10 % EPIDURAL INFUSION (WH - ANES)
14.0000 mL/h | INTRAMUSCULAR | Status: DC
  Administered 2011-06-03 (×2): 14 mL/h via EPIDURAL
  Filled 2011-06-02 (×3): qty 60

## 2011-06-02 MED ORDER — FLEET ENEMA 7-19 GM/118ML RE ENEM: 1.0000 | ENEMA | RECTAL | Status: DC | PRN

## 2011-06-02 MED ORDER — TERBUTALINE SULFATE 1 MG/ML IJ SOLN: 0.2500 mg | Freq: Once | INTRAMUSCULAR | Status: AC | PRN

## 2011-06-02 MED ORDER — OXYCODONE-ACETAMINOPHEN 5-325 MG PO TABS: 1.0000 | ORAL_TABLET | ORAL | Status: DC | PRN

## 2011-06-02 MED ORDER — CITRIC ACID-SODIUM CITRATE 334-500 MG/5ML PO SOLN: 30.0000 mL | ORAL | Status: DC | PRN

## 2011-06-02 MED ORDER — EPHEDRINE 5 MG/ML INJ
10.0000 mg | INTRAVENOUS | Status: DC | PRN
  Filled 2011-06-02: qty 4

## 2011-06-02 MED ORDER — ACETAMINOPHEN 325 MG PO TABS: 650.0000 mg | ORAL_TABLET | ORAL | Status: DC | PRN

## 2011-06-02 MED ORDER — NALBUPHINE SYRINGE 5 MG/0.5 ML: 5.0000 mg | INJECTION | INTRAMUSCULAR | Status: DC | PRN

## 2011-06-02 MED ORDER — PHENYLEPHRINE 40 MCG/ML (10ML) SYRINGE FOR IV PUSH (FOR BLOOD PRESSURE SUPPORT): 80.0000 ug | PREFILLED_SYRINGE | INTRAVENOUS | Status: DC | PRN

## 2011-06-02 MED ORDER — FENTANYL 2.5 MCG/ML BUPIVACAINE 1/10 % EPIDURAL INFUSION (WH - ANES)
INTRAMUSCULAR | Status: DC | PRN
  Administered 2011-06-02: 15 mL/h via EPIDURAL

## 2011-06-02 MED ORDER — HYDROXYZINE HCL 25 MG PO TABS
25.0000 mg | ORAL_TABLET | Freq: Once | ORAL | Status: AC
  Administered 2011-06-02: 25 mg via ORAL

## 2011-06-02 MED ORDER — LIDOCAINE HCL (PF) 1 % IJ SOLN
30.0000 mL | INTRAMUSCULAR | Status: DC | PRN
  Filled 2011-06-02: qty 30

## 2011-06-02 MED ORDER — OXYTOCIN 20 UNITS IN LACTATED RINGERS INFUSION - SIMPLE
125.0000 mL/h | Freq: Once | INTRAVENOUS | Status: AC
  Administered 2011-06-03: 125 mL/h via INTRAVENOUS
  Filled 2011-06-02: qty 1000

## 2011-06-02 MED ORDER — PHENYLEPHRINE 40 MCG/ML (10ML) SYRINGE FOR IV PUSH (FOR BLOOD PRESSURE SUPPORT)
80.0000 ug | PREFILLED_SYRINGE | INTRAVENOUS | Status: DC | PRN
  Filled 2011-06-02: qty 5

## 2011-06-02 MED ORDER — HYDROXYZINE HCL 50 MG/ML IM SOLN: 25.0000 mg | Freq: Four times a day (QID) | INTRAMUSCULAR | Status: DC | PRN

## 2011-06-02 MED ORDER — IBUPROFEN 600 MG PO TABS: 600.0000 mg | ORAL_TABLET | Freq: Four times a day (QID) | ORAL | Status: DC | PRN

## 2011-06-02 NOTE — Progress Notes (Signed)
Lisa Rich is a 26 y.o. G2P1001 at [redacted]w[redacted]d admitted for rupture of membranes  Subjective: Pt comfortable with family members in room for support.  Pt walked in hallway and reports increased back pain but no regular uterine contractions.  Objective: BP 141/81  Pulse 86  Temp(Src) 98 F (36.7 C) (Oral)  Resp 20  Ht 5\' 9"  (1.753 m)  Wt 127.517 kg (281 lb 2 oz)  BMI 41.51 kg/m2  LMP 09/04/2010      FHT:  FHR: 125 bpm, variability: moderate,  accelerations:  Present,  decelerations:  Absent UC:   irregular, every 3-6 minutes SVE:   Dilation: 5 Effacement (%): 70 Station: -2 Exam by:: Renaldo Harrison, RN  Labs: Lab Results  Component Value Date   WBC 13.5* 06/02/2011   HGB 12.4 06/02/2011   HCT 35.7* 06/02/2011   MCV 93.7 06/02/2011   PLT 206 06/02/2011    Assessment / Plan: Spontaneous labor, progressing normally Discussed Pitocin with pt for augmentation and she desires augmentation Pitocin started 38milliunits/min, increase per protocol  Labor: Progressing normally Preeclampsia:  N/A Fetal Wellbeing:  Category I Pain Control:  Labor support without medications I/D:  n/a Anticipated MOD:  NSVD  LEFTWICH-KIRBY, Aidin Doane 06/02/2011, 9:03 PM

## 2011-06-02 NOTE — Progress Notes (Signed)
EFM removed pt up to walk 

## 2011-06-02 NOTE — Progress Notes (Signed)
Pt reports that she feels a panic attack starting. RN at bedside providing emotional support, cool rags, and controlled breathing exercises

## 2011-06-02 NOTE — Progress Notes (Signed)
EFM applied.

## 2011-06-02 NOTE — Progress Notes (Signed)
LR restarted

## 2011-06-02 NOTE — H&P (Signed)
Lisa Rich is a 26 y.o. female presenting for LOF and contractions. Maternal Medical History:  Reason for admission: Reason for admission: rupture of membranes and contractions.  Reason for Admission:   nauseaContractions: Onset was 1-2 hours ago.   Frequency: regular.   Duration is approximately 1 minute.   Perceived severity is mild.    Fetal activity: Perceived fetal activity is normal.   Last perceived fetal movement was within the past hour.    Prenatal complications: no prenatal complications Prenatal Complications - Diabetes: none.    OB History    Grav Para Term Preterm Abortions TAB SAB Ect Mult Living   2 1 1  0 0 0 0 0 0 1     Past Medical History  Diagnosis Date  . Abnormal Pap smear 2007  . Pregnancy induced hypertension 2008    preclampsia  . Depression    Past Surgical History  Procedure Date  . Wisdom tooth extraction 2011     x 4   Family History: family history includes Asthma in her maternal grandmother; Bipolar disorder in her father; Depression in her mother; Diabetes in her maternal grandmother; Heart disease in her maternal grandfather and maternal grandmother; and Hypertension in her father and mother. Social History:  reports that she has quit smoking. Her smoking use included Cigarettes. She smoked .25 packs per day. She has never used smokeless tobacco. She reports that she does not drink alcohol or use illicit drugs.  Review of Systems  Constitutional: Negative for fever, chills and malaise/fatigue.  Eyes: Negative for blurred vision.  Respiratory: Negative for cough and shortness of breath.   Cardiovascular: Negative for chest pain.  Gastrointestinal: Negative for heartburn, nausea and vomiting.  Genitourinary: Negative for dysuria, urgency and frequency.  Musculoskeletal: Negative.   Neurological: Negative for dizziness and headaches.  Psychiatric/Behavioral: Negative for depression.    Dilation: 4 Effacement (%): 70 Station:  -3 Exam by:: Leftwich-Kirby CNM Blood pressure 152/80, pulse 88, temperature 97.7 F (36.5 C), temperature source Oral, resp. rate 20, height 5\' 9"  (1.753 m), weight 127.517 kg (281 lb 2 oz), last menstrual period 09/04/2010. Maternal Exam:  Uterine Assessment: Contraction strength is mild.  Contraction duration is 60 seconds. Contraction frequency is irregular.   Abdomen: Patient reports no abdominal tenderness. Estimated fetal weight is 52%ile on anatomy scan, 1 hour glucose 82.   Fetal presentation: vertex  Introitus: Ferning test: positive.  Amniotic fluid character: clear.  Cervix: Cervix evaluated by digital exam.     Fetal Exam Fetal Monitor Review: Mode: ultrasound.   Baseline rate: 135.  Variability: moderate (6-25 bpm).   Pattern: accelerations present and no decelerations.    Fetal State Assessment: Category I - tracings are normal.     Physical Exam  Nursing note and vitals reviewed. Constitutional: She is oriented to person, place, and time. She appears well-developed and well-nourished.  Neck: Normal range of motion.  Cardiovascular: Normal rate, regular rhythm and normal heart sounds.   Respiratory: Effort normal and breath sounds normal.  GI: Soft.  Genitourinary:       Cervix 4/75/-3, soft, anterior  Musculoskeletal: Normal range of motion.  Neurological: She is alert and oriented to person, place, and time. She has normal reflexes.  Skin: Skin is warm and dry.  Psychiatric: She has a normal mood and affect. Her behavior is normal. Judgment and thought content normal.    Prenatal labs: ABO, Rh: O/POS/-- (09/26 1551) Antibody: NEG (09/26 1551) Rubella: 57.8 (09/26 1551) RPR: NON REAC (  09/26 1551)  HBsAg: NEGATIVE (09/26 1551)  HIV: NON REACTIVE (09/26 1551)  GBS:   Negative on 05/18/11  Assessment/Plan: SROM Early labor  Admit to birthing suites Expectant management Anticipate NSVD  LEFTWICH-KIRBY, Yailene Badia 06/02/2011, 4:55 PM

## 2011-06-02 NOTE — Anesthesia Procedure Notes (Signed)
Epidural Patient location during procedure: OB Start time: 06/02/2011 9:34 PM  Staffing Anesthesiologist: Luetta Piazza A. Performed by: anesthesiologist   Preanesthetic Checklist Completed: patient identified, site marked, surgical consent, pre-op evaluation, timeout performed, IV checked, risks and benefits discussed and monitors and equipment checked  Epidural Patient position: sitting Prep: site prepped and draped and DuraPrep Patient monitoring: continuous pulse ox and blood pressure Approach: midline Injection technique: LOR air  Needle:  Needle type: Tuohy  Needle gauge: 17 G Needle length: 9 cm Needle insertion depth: 7 cm Catheter type: closed end flexible Catheter size: 19 Gauge Catheter at skin depth: 12 cm Test dose: negative and Other  Assessment Events: blood not aspirated, injection not painful, no injection resistance, negative IV test and no paresthesia  Additional Notes Patient identified. Risks and benefits discussed including failed block, incomplete  Pain control, post dural puncture headache, nerve damage, paralysis, blood pressure Changes, nausea, vomiting, reactions to medications-both toxic and allergic and post Partum back pain. All questions were answered. Patient expressed understanding and wished to proceed. Sterile technique was used throughout procedure. Epidural site was Dressed with sterile barrier dressing. No paresthesias, signs of intravascular injection Or signs of intrathecal spread were encountered.  Patient was more comfortable after the epidural was dosed. Please see RN's note for documentation of vital signs and FHR which are stable.

## 2011-06-02 NOTE — Progress Notes (Signed)
Lisa Rich is a 25 y.o. G2P1001 at [redacted]w[redacted]d by LMP admitted for active labor  Subjective: Moderate relief after epidural. Contractions q3-4  Objective: BP 142/89  Pulse 90  Temp(Src) 97.8 F (36.6 C) (Oral)  Resp 24  Ht 5\' 9"  (1.753 m)  Wt 127.517 kg (281 lb 2 oz)  BMI 41.51 kg/m2  SpO2 100%  LMP 09/04/2010      FHT:  FHR: 145 bpm, variability: moderate,  accelerations:  Present,  decelerations:  Absent UC:   regular, every 3-4 minutes SVE:   Dilation: 5 Effacement (%): 70 Station: -1 Exam by:: Lisa Harrison, RN  Labs: Lab Results  Component Value Date   WBC 13.5* 06/02/2011   HGB 12.4 06/02/2011   HCT 35.7* 06/02/2011   MCV 93.7 06/02/2011   PLT 206 06/02/2011    Assessment / Plan: Augmentation of labor, progressing well  Labor: Progressing normally Preeclampsia:  n/a Fetal Wellbeing:  Category I Pain Control:  Epidural I/D:   Anticipated MOD:  NSVD  Lisa Rich V 06/02/2011, 11:25 PM

## 2011-06-02 NOTE — Anesthesia Preprocedure Evaluation (Signed)
Anesthesia Evaluation  Patient identified by MRN, date of birth, ID band Patient awake    Reviewed: Allergy & Precautions, H&P , Patient's Chart, lab work & pertinent test results  Airway Mallampati: III TM Distance: >3 FB Neck ROM: full    Dental No notable dental hx. (+) Teeth Intact   Pulmonary neg pulmonary ROS,  breath sounds clear to auscultation  Pulmonary exam normal       Cardiovascular negative cardio ROS  Rhythm:regular Rate:Normal  Hx/o Precclampsia with 1st pregnancy   Neuro/Psych negative neurological ROS  negative psych ROS   GI/Hepatic negative GI ROS, Neg liver ROS,   Endo/Other  Morbid obesity  Renal/GU negative Renal ROS  negative genitourinary   Musculoskeletal   Abdominal Normal abdominal exam  (+)   Peds  Hematology negative hematology ROS (+)   Anesthesia Other Findings   Reproductive/Obstetrics (+) Pregnancy                           Anesthesia Physical Anesthesia Plan  ASA: III  Anesthesia Plan: Epidural   Post-op Pain Management:    Induction:   Airway Management Planned:   Additional Equipment:   Intra-op Plan:   Post-operative Plan:   Informed Consent: I have reviewed the patients History and Physical, chart, labs and discussed the procedure including the risks, benefits and alternatives for the proposed anesthesia with the patient or authorized representative who has indicated his/her understanding and acceptance.     Plan Discussed with: Anesthesiologist  Anesthesia Plan Comments:         Anesthesia Quick Evaluation

## 2011-06-03 ENCOUNTER — Encounter (HOSPITAL_COMMUNITY): Payer: Self-pay

## 2011-06-03 LAB — PROTEIN / CREATININE RATIO, URINE
Creatinine, Urine: 157.73 mg/dL
Protein Creatinine Ratio: 0.1 (ref 0.00–0.15)
Total Protein, Urine: 15.4 mg/dL

## 2011-06-03 LAB — RPR: RPR Ser Ql: NONREACTIVE

## 2011-06-03 MED ORDER — WITCH HAZEL-GLYCERIN EX PADS: 1.0000 "application " | MEDICATED_PAD | CUTANEOUS | Status: DC | PRN

## 2011-06-03 MED ORDER — DIPHENHYDRAMINE HCL 25 MG PO CAPS: 25.0000 mg | ORAL_CAPSULE | Freq: Four times a day (QID) | ORAL | Status: DC | PRN

## 2011-06-03 MED ORDER — PRENATAL MULTIVITAMIN CH
1.0000 | ORAL_TABLET | Freq: Every day | ORAL | Status: DC
  Administered 2011-06-03 – 2011-06-05 (×3): 1 via ORAL
  Filled 2011-06-03 (×3): qty 1

## 2011-06-03 MED ORDER — ZOLPIDEM TARTRATE 5 MG PO TABS: 5.0000 mg | ORAL_TABLET | Freq: Every evening | ORAL | Status: DC | PRN

## 2011-06-03 MED ORDER — BENZOCAINE-MENTHOL 20-0.5 % EX AERO
INHALATION_SPRAY | CUTANEOUS | Status: AC
  Filled 2011-06-03: qty 56

## 2011-06-03 MED ORDER — SIMETHICONE 80 MG PO CHEW: 80.0000 mg | CHEWABLE_TABLET | ORAL | Status: DC | PRN

## 2011-06-03 MED ORDER — TETANUS-DIPHTH-ACELL PERTUSSIS 5-2.5-18.5 LF-MCG/0.5 IM SUSP
0.5000 mL | Freq: Once | INTRAMUSCULAR | Status: AC
  Administered 2011-06-04: 0.5 mL via INTRAMUSCULAR
  Filled 2011-06-03: qty 0.5

## 2011-06-03 MED ORDER — IBUPROFEN 600 MG PO TABS
600.0000 mg | ORAL_TABLET | Freq: Four times a day (QID) | ORAL | Status: DC
  Administered 2011-06-03 – 2011-06-04 (×5): 600 mg via ORAL
  Filled 2011-06-03 (×6): qty 1

## 2011-06-03 MED ORDER — ONDANSETRON HCL 4 MG PO TABS: 4.0000 mg | ORAL_TABLET | ORAL | Status: DC | PRN

## 2011-06-03 MED ORDER — SENNOSIDES-DOCUSATE SODIUM 8.6-50 MG PO TABS
2.0000 | ORAL_TABLET | Freq: Every day | ORAL | Status: DC
  Administered 2011-06-03 – 2011-06-04 (×2): 2 via ORAL

## 2011-06-03 MED ORDER — LANOLIN HYDROUS EX OINT: TOPICAL_OINTMENT | CUTANEOUS | Status: DC | PRN

## 2011-06-03 MED ORDER — BENZOCAINE-MENTHOL 20-0.5 % EX AERO: 1.0000 "application " | INHALATION_SPRAY | CUTANEOUS | Status: DC | PRN

## 2011-06-03 MED ORDER — ONDANSETRON HCL 4 MG/2ML IJ SOLN: 4.0000 mg | INTRAMUSCULAR | Status: DC | PRN

## 2011-06-03 MED ORDER — DIBUCAINE 1 % RE OINT: 1.0000 "application " | TOPICAL_OINTMENT | RECTAL | Status: DC | PRN

## 2011-06-03 MED ORDER — OXYCODONE-ACETAMINOPHEN 5-325 MG PO TABS: 1.0000 | ORAL_TABLET | ORAL | Status: DC | PRN

## 2011-06-03 NOTE — Progress Notes (Signed)
UR Chart review completed.  

## 2011-06-03 NOTE — Anesthesia Postprocedure Evaluation (Signed)
Anesthesia Post Note  Patient: Lisa Rich  Procedure(s) Performed: * No procedures listed *  Anesthesia type: Epidural  Patient location: Mother/Baby  Post pain: Pain level controlled  Post assessment: Post-op Vital signs reviewed  Last Vitals:  Filed Vitals:   06/03/11 1330  BP: 133/91  Pulse: 103  Temp: 36.7 C  Resp: 20    Post vital signs: Reviewed  Level of consciousness: awake  Complications: No apparent anesthesia complications

## 2011-06-03 NOTE — Progress Notes (Signed)
Patient ID: Lisa Rich, female   DOB: 07-May-1985, 26 y.o.   MRN: [id number] Lisa Rich is a 26 y.o. G2P1001 at [redacted]w[redacted]d admitted for SROM, early labor  Subjective: Comfortable after epidural. Had what she felt was a "panic attack" shortly after the epidural,; sx resolved after Visaril.  Objective: BP 135/83  Pulse 88  Temp(Src) 98.4 F (36.9 C) (Oral)  Resp 20  Ht 5\' 9"  (1.753 m)  Wt 127.517 kg (281 lb 2 oz)  BMI 41.51 kg/m2  SpO2 100%  LMP 09/04/2010  Fetal Heart FHR: 120 bpm, variability: moderate,  accelerations:  Present,  decelerations:  Absent   Contractions: q 2-5 with coupling. Pit @16mu   SVE:   Dilation: 6 Effacement (%): 70 Station: -1 Exam by:: Renaldo Harrison, RN Leaking clear fluid   Results for orders placed during the hospital encounter of 06/02/11 (from the past 24 hour(s))  CBC     Status: Abnormal   Collection Time   06/02/11  2:40 PM      Component Value Range   WBC 13.5 (*) 4.0 - 10.5 (K/uL)   RBC 3.81 (*) 3.87 - 5.11 (MIL/uL)   Hemoglobin 12.4  12.0 - 15.0 (g/dL)   HCT 40.9 (*) 81.1 - 46.0 (%)   MCV 93.7  78.0 - 100.0 (fL)   MCH 32.5  26.0 - 34.0 (pg)   MCHC 34.7  30.0 - 36.0 (g/dL)   RDW 91.4  78.2 - 95.6 (%)   Platelets 206  150 - 400 (K/uL)  RPR     Status: Normal   Collection Time   06/02/11  2:40 PM      Component Value Range   RPR NON REACTIVE  NON REACTIVE   PROTEIN / CREATININE RATIO, URINE     Status: Normal   Collection Time   06/02/11 10:57 PM      Component Value Range   Creatinine, Urine 157.73     Total Protein, Urine 15.4     PROTEIN CREATININE RATIO 0.10  0.00 - 0.15   COMPREHENSIVE METABOLIC PANEL     Status: Abnormal   Collection Time   06/02/11 11:05 PM      Component Value Range   Sodium 134 (*) 135 - 145 (mEq/L)   Potassium 3.6  3.5 - 5.1 (mEq/L)   Chloride 101  96 - 112 (mEq/L)   CO2 18 (*) 19 - 32 (mEq/L)   Glucose, Bld 90  70 - 99 (mg/dL)   BUN 9  6 - 23 (mg/dL)   Creatinine, Ser 2.13 (*) 0.50 - 1.10 (mg/dL)   Calcium 9.3  8.4 - 08.6 (mg/dL)   Total Protein 6.1  6.0 - 8.3 (g/dL)   Albumin 2.9 (*) 3.5 - 5.2 (g/dL)   AST 19  0 - 37 (U/L)   ALT 11  0 - 35 (U/L)   Alkaline Phosphatase 150 (*) 39 - 117 (U/L)   Total Bilirubin 1.0  0.3 - 1.2 (mg/dL)   GFR calc non Af Amer >90  >90 (mL/min)   GFR calc Af Amer >90  >90 (mL/min)    Assessment / Plan:  Labor: active BPs mildly elevated Fetal Wellbeing: Cat1 FHR Pain Control:  adequate Expected mode of delivery: NSVD  Kazim Corrales 06/03/2011, 1:39 AM

## 2011-06-03 NOTE — Progress Notes (Signed)
Patient transferred to womens unit via wheelchair. Report given to Joycelyn Man, RN.

## 2011-06-03 NOTE — Anesthesia Postprocedure Evaluation (Signed)
  Anesthesia Post-op Note  Patient: Lisa Rich  Procedure(s) Performed: * No procedures listed *  Patient Location: Women's Unit  Anesthesia Type: Epidural  Level of Consciousness: awake, alert  and oriented  Airway and Oxygen Therapy: Patient Spontanous Breathing  Post-op Pain: mild  Post-op Assessment: Patient's Cardiovascular Status Stable, Respiratory Function Stable, Patent Airway, No signs of Nausea or vomiting and Pain level controlled  Post-op Vital Signs: stable  Complications: No apparent anesthesia complications

## 2011-06-03 NOTE — Progress Notes (Signed)
FOB offered stationary chair for epidural placement. FOB denies chair.

## 2011-06-03 NOTE — Addendum Note (Signed)
Addendum  created 06/03/11 2108 by Lincoln Brigham, CRNA   Modules edited:Charges VN, Notes Section

## 2011-06-03 NOTE — H&P (Signed)
Chart reviewed and agree with management and plan.  

## 2011-06-03 NOTE — Progress Notes (Signed)
Patient ID: Lisa Rich, female   DOB: 02-17-1986, 26 y.o.   MRN: [id number]  VE: 5-6/80/-2 vtx AROM forebag -> clear IUPC placed  Plan: Titrate pitocin to UCs and recheck 2 hr.

## 2011-06-04 NOTE — Progress Notes (Signed)
Post Partum Day 1  Subjective: no complaints, up ad lib, voiding, tolerating PO, + flatus and bowel movements.   Objective: Blood pressure 129/83, pulse 79, temperature 98.7 F (37.1 C), temperature source Oral, resp. rate 19, height 5\' 9"  (1.753 m), weight 127.517 kg (281 lb 2 oz), last menstrual period 09/04/2010, SpO2 97.00%, unknown if currently breastfeeding.  Physical Exam:  General: alert, cooperative, appears stated age and no distress Lochia: appropriate Uterine Fundus: firm DVT Evaluation: No evidence of DVT seen on physical exam. Negative Homan's sign. No cords or calf tenderness. No significant calf/ankle edema.   Basename 06/02/11 1440  HGB 12.4  HCT 35.7*    Assessment/Plan: Plan for discharge tomorrow Pediatrician: evaluating list today, and making phone calls Contraception: Mirena     LOS: 2 days   Lisa Rich 06/04/2011, 7:40 AM    Patient seen and examined.  Agree with above note.  Lisa Rich 9:32 AM 06/04/2011

## 2011-06-05 MED ORDER — IBUPROFEN 600 MG PO TABS: 600.0000 mg | ORAL_TABLET | Freq: Four times a day (QID) | ORAL | Status: AC

## 2011-06-05 MED ORDER — BENZOCAINE-MENTHOL 20-0.5 % EX AERO
INHALATION_SPRAY | CUTANEOUS | Status: AC
  Filled 2011-06-05: qty 56

## 2011-06-05 NOTE — Discharge Instructions (Signed)
Vaginal Delivery Care After  Change your pad on each trip to the bathroom.   Wipe gently with toilet paper during your hospital stay. Always wipe from front to back. A spray bottle with warm tap water could also be used or a towelette if available.   Place your soiled pad and toilet paper in a bathroom wastebasket with a plastic bag liner.   During your hospital stay, save any clots. If you pass a clot while on the toilet, do not flush it. Also, if your vaginal flow seems excessive to you, notify nursing personnel.   The first time you get out of bed after delivery, wait for assistance from a nurse. Do not get up alone at any time if you feel weak or dizzy.   Bend and extend your ankles forcefully so that you feel the calves of your legs get hard. Do this 6 times every hour when you are in bed and awake.   Do not sit with one foot under you, dangle your legs over the edge of the bed, or maintain a position that hinders the circulation in your legs.   Many women experience after pains for 2 to 3 days after delivery. These after pains are mild uterine contractions. Ask the nurse for a pain medication if you need something for this. Sometimes breastfeeding stimulates after pains; if you find this to be true, ask for the medication  -  hour before the next feeding.   For you and your infant's protection, do not go beyond the door(s) of the obstetric unit. Do not carry your baby in your arms in the hallway. When taking your baby to and from your room, put your baby in the bassinet and push the bassinet.   Mothers may have their babies in their room as much as they desire.  Document Released: 02/06/2000 Document Revised: 01/28/2011 Document Reviewed: 01/06/2007 Exit[employer] Patient Information 2012 [employer]. 

## 2011-06-05 NOTE — Progress Notes (Signed)
Clinical Social Work Department PSYCHOSOCIAL ASSESSMENT - MATERNAL/CHILD 06/05/2011  Patient:  Lisa Rich,Lisa Rich  Account Number:  [id number]  Admit Date:  06/02/2011  Childs Name:   Lisa Rich    Clinical Social Worker:  Sugar Vanzandt, LCSW   Date/Time:  06/05/2011 11:30 AM  Date Referred:  06/05/2011   Referral source  Physician     Referred reason  Depression/Anxiety   Other referral source:    I:  FAMILY / HOME ENVIRONMENT Child's legal guardian:  PARENT  Guardian - Name Guardian - Age Guardian - Address  Lisa Rich 25 [address] [place], [place] [address]   Other household support members/support persons Name Relationship DOB  Lisa Rich MOTHER   5 year old BROTHER    Other support:   FOB, unable to get name, MOB reports supportive    II  PSYCHOSOCIAL DATA Information Source:  Patient Interview  Financial and Community Resources Employment:   Financial resources:  Medicaid If Medicaid - County:  [place]  School / Grade:   Maternity Care Coordinator / Child Services Coordination / Early Interventions:  Cultural issues impacting care:    III  STRENGTHS Strengths  Adequate Resources  Compliance with medical plan  Home prepared for Child (including basic supplies)  Supportive family/friends   Strength comment:    IV  RISK FACTORS AND CURRENT PROBLEMS Current Problem:  None   Risk Factor & Current Problem Patient Issue Family Issue Risk Factor / Current Problem Comment  Mental Illness Y N anxiety/panic attacks    V  SOCIAL WORK ASSESSMENT CSW spoke with MOB at bedside.  MOB reports no current emotional concerns, however just had a mild panic attack yesterday.  MOB reports recently being put on med management for panic attacks and this is working well for her. SHe reprots good family support and that FOB is involved and supportive, legally seperated.  No concerns with insurance.  Please reconsult CSW if any further needs arise.      VI SOCIAL WORK  PLAN Social Work Plan  No Further Intervention Required / No Barriers to Discharge   Type of pt/family education:   If child protective services report - county:   If child protective services report - date:   Information/referral to community resources comment:   Other social work plan:    

## 2011-06-05 NOTE — Discharge Summary (Signed)
Obstetric Discharge Summary Reason for Admission: onset of labor and rupture of membranes Prenatal Procedures: none Intrapartum Procedures: spontaneous vaginal delivery Postpartum Procedures: none Complications-Operative and Postpartum: 2nd degree perineal laceration Hemoglobin  Date Value Range Status  06/02/2011 12.4  12.0-15.0 (g/dL) Final     HCT  Date Value Range Status  06/02/2011 35.7* 36.0-46.0 (%) Final    Physical Exam:  General: alert, cooperative and no distress Lochia: appropriate Uterine Fundus: firm DVT Evaluation: No evidence of DVT seen on physical exam. Negative Homan's sign. No cords or calf tenderness. No significant calf/ankle edema.  Discharge Diagnoses: Term Pregnancy-delivered  Discharge Information: Date: 06/05/2011 Activity: pelvic rest Diet: routine Medications: PNV and Ibuprofen Condition: stable Instructions: refer to practice specific booklet Discharge to: home Follow-up Information    Follow up with [facility] in 6 weeks.   Contact information:   [address] [place] [address]          Newborn Data: Live born female  Birth Weight: 9 lb 6.3 oz (4260 g) APGAR: 9, 9  Home with mother.  Lisa Rich JEHIEL 06/05/2011, 7:33 AM

## 2011-06-08 ENCOUNTER — Encounter: Payer: Medicaid Other | Admitting: Obstetrics & Gynecology

## 2011-07-20 ENCOUNTER — Encounter: Payer: Self-pay | Admitting: Obstetrics & Gynecology

## 2011-07-20 ENCOUNTER — Ambulatory Visit (INDEPENDENT_AMBULATORY_CARE_PROVIDER_SITE_OTHER): Payer: Medicaid Other | Admitting: Obstetrics & Gynecology

## 2011-07-20 NOTE — Progress Notes (Signed)
  Subjective:    Patient ID: Lisa Rich, female    DOB: 06/12/1985, 26 y.o.   MRN: [id number]  HPI  [name] is now about 6 weeks pp status post NSVD. Her son is growing well (12 #). She plans to get Mirena for birth control next week. She has been using condoms without dysparunia. She has a h/o being treated for depression and anxiety and she has an appt at [facility] to treat her anxiety. She declines treatment today. She denies any problems with bowel or bladder.  Review of Systems Pap normal 10/12    Objective:   Physical Exam Well- healed perineum and cervix NSSA, NT, no adnexal masses       Assessment & Plan:  Pp- stable RTC 11/13 for annual RTC next week for Mirena

## 2011-07-26 ENCOUNTER — Ambulatory Visit (INDEPENDENT_AMBULATORY_CARE_PROVIDER_SITE_OTHER): Payer: Medicaid Other | Admitting: Obstetrics & Gynecology

## 2011-07-26 VITALS — BP 117/81 | HR 63 | Ht 69.0 in | Wt 245.0 lb

## 2011-07-26 DIAGNOSIS — Z01812 Encounter for preprocedural laboratory examination: Secondary | ICD-10-CM

## 2011-07-26 DIAGNOSIS — Z975 Presence of (intrauterine) contraceptive device: Secondary | ICD-10-CM

## 2011-07-26 DIAGNOSIS — Z30430 Encounter for insertion of intrauterine contraceptive device: Secondary | ICD-10-CM

## 2011-07-26 NOTE — Progress Notes (Signed)
  Subjective:    Patient ID: Lisa Rich, female    DOB: May 16, 1985, 26 y.o.   MRN: [id number]  HPI  She is here for the insertion of a Mirena. Her UPT is negative.    Review of Systems    Her last pap was normal 10/12. Objective:   Physical Exam A time out was done after the consent was signed. A bimanual exam revealed a NSSA uterus. Her cervix was prepped with betadine. The anterior lip was grasped. The Mirena was easily placed. The strings were cut to 3 cm. She tolerated the procedure well.       Assessment & Plan:  Mirena placed.  RTC 1 month.

## 2011-08-19 ENCOUNTER — Ambulatory Visit: Payer: Medicaid Other | Admitting: Obstetrics & Gynecology

## 2012-04-08 ENCOUNTER — Other Ambulatory Visit: Payer: Self-pay

## 2012-08-16 ENCOUNTER — Emergency Department (HOSPITAL_COMMUNITY): Payer: Self-pay

## 2012-08-16 ENCOUNTER — Encounter (HOSPITAL_COMMUNITY): Payer: Self-pay | Admitting: Emergency Medicine

## 2012-08-16 ENCOUNTER — Emergency Department (HOSPITAL_COMMUNITY)
Admission: EM | Admit: 2012-08-16 | Discharge: 2012-08-16 | Disposition: A | Discharge status: Home / Self Care | Payer: Self-pay | Attending: Emergency Medicine | Admitting: Emergency Medicine

## 2012-08-16 DIAGNOSIS — M7918 Myalgia, other site: Secondary | ICD-10-CM

## 2012-08-16 DIAGNOSIS — Z8659 Personal history of other mental and behavioral disorders: Secondary | ICD-10-CM | POA: Insufficient documentation

## 2012-08-16 DIAGNOSIS — Z87891 Personal history of nicotine dependence: Secondary | ICD-10-CM | POA: Insufficient documentation

## 2012-08-16 DIAGNOSIS — R0789 Other chest pain: Secondary | ICD-10-CM | POA: Insufficient documentation

## 2012-08-16 DIAGNOSIS — R21 Rash and other nonspecific skin eruption: Secondary | ICD-10-CM | POA: Insufficient documentation

## 2012-08-16 DIAGNOSIS — R05 Cough: Secondary | ICD-10-CM | POA: Insufficient documentation

## 2012-08-16 DIAGNOSIS — R0982 Postnasal drip: Secondary | ICD-10-CM | POA: Insufficient documentation

## 2012-08-16 DIAGNOSIS — R109 Unspecified abdominal pain: Secondary | ICD-10-CM | POA: Insufficient documentation

## 2012-08-16 DIAGNOSIS — Z3202 Encounter for pregnancy test, result negative: Secondary | ICD-10-CM | POA: Insufficient documentation

## 2012-08-16 DIAGNOSIS — J069 Acute upper respiratory infection, unspecified: Secondary | ICD-10-CM | POA: Insufficient documentation

## 2012-08-16 DIAGNOSIS — R059 Cough, unspecified: Secondary | ICD-10-CM | POA: Insufficient documentation

## 2012-08-16 LAB — URINALYSIS, ROUTINE W REFLEX MICROSCOPIC
Glucose, UA: NEGATIVE mg/dL
Hgb urine dipstick: NEGATIVE
Specific Gravity, Urine: 1.025 (ref 1.005–1.030)
Urobilinogen, UA: 0.2 mg/dL (ref 0.0–1.0)
pH: 6 (ref 5.0–8.0)

## 2012-08-16 LAB — POCT PREGNANCY, URINE: Preg Test, Ur: NEGATIVE

## 2012-08-16 MED ORDER — PSEUDOEPHEDRINE HCL 60 MG PO TABS: 60.0000 mg | ORAL_TABLET | Freq: Four times a day (QID) | ORAL | Status: DC | PRN

## 2012-08-16 MED ORDER — HYDROCODONE-ACETAMINOPHEN 5-325 MG PO TABS: 1.0000 | ORAL_TABLET | ORAL | Status: DC | PRN

## 2012-08-16 NOTE — ED Notes (Signed)
Patient c/o cough with nasal congestion x2 weeks. Patient reports chest congestion stating "Now its in my chest. It burns to take a deep breath." Patient also c/o flank pain 2 months with tenderness to touch and intermittent stabbing pain. Denies any nausea or vomiting. Patient reports having an IUD. Patient also reports rash on upper chest and lower back chest for 1 year. Per patient rash on back is intermittent and itches.

## 2012-08-16 NOTE — ED Provider Notes (Signed)
History    CSN: [id number] Arrival date & time 08/16/12  4098  First MD Initiated Contact with Patient 08/16/12 [phone]     Chief Complaint  Patient presents with  . Nasal Congestion  . Cough  . Flank Pain  . Rash   (Consider location/radiation/quality/duration/timing/severity/associated sxs/prior Treatment) HPI Comments: Patient is a 27 year old female who presents to the emergency department with a chief complaint of cough and nasal congestion. This is been going on for approximately 2 weeks. In the last few days the patient states that she is having pain and at times burning with deep breathing and with cough. The patient denies any high fever. She denies any hemoptysis. His been no injury or trauma to the chest wall.  Patient also gives a history of 2 months of pain in the right flank. She states that there is tenderness to touch and with certain movements. She denies any known injury. States she has not changed her exercise routine. She's not had any surgery or procedures involving the flank area. There's been no change in her urine, and particularly no known blood seen in the urine. Patient has tried ibuprofen for this problem but it has not resolved.  The history is provided by the patient.   Past Medical History  Diagnosis Date  . Abnormal Pap smear 2007  . Pregnancy induced hypertension 2008    preclampsia  . Depression    Past Surgical History  Procedure Laterality Date  . Wisdom tooth extraction  2011     x 4   Family History  Problem Relation Age of Onset  . Hypertension Mother   . Depression Mother   . Hypertension Father   . Bipolar disorder Father   . Diabetes Maternal Grandmother   . Asthma Maternal Grandmother   . Heart disease Maternal Grandmother     CHF  . Heart disease Maternal Grandfather     CHF   History  Substance Use Topics  . Smoking status: Former Smoker -- 0.25 packs/day    Types: Cigarettes  . Smokeless tobacco: Never Used  . Alcohol  Use: No   OB History   Grav Para Term Preterm Abortions TAB SAB Ect Mult Living   2 2 2  0 0 0 0 0 0 2     Review of Systems  Constitutional: Negative for activity change.       All ROS Neg except as noted in HPI  HENT: Positive for congestion and postnasal drip. Negative for nosebleeds and neck pain.   Eyes: Negative for photophobia and discharge.  Respiratory: Positive for cough. Negative for shortness of breath and wheezing.   Cardiovascular: Negative for chest pain and palpitations.  Gastrointestinal: Negative for abdominal pain and blood in stool.  Genitourinary: Positive for flank pain. Negative for dysuria, frequency and hematuria.  Musculoskeletal: Negative for back pain and arthralgias.  Skin: Positive for rash.  Neurological: Negative for dizziness, seizures and speech difficulty.  Psychiatric/Behavioral: Negative for hallucinations and confusion.    Allergies  Review of patient's allergies indicates no known allergies.  Home Medications   Current Outpatient Rx  Name  Route  Sig  Dispense  Refill  . ibuprofen (ADVIL,MOTRIN) 200 MG tablet   Oral   Take 200 mg by mouth daily as needed for pain.          BP 145/95  Pulse 71  Temp(Src) 97.8 F (36.6 C) (Oral)  Resp 20  Ht 5\' 8"  (1.727 m)  Wt 260 lb (117.935 kg)  BMI 39.54 kg/m2  SpO2 99%  LMP 07/17/2012 Physical Exam  Nursing note and vitals reviewed. Constitutional: She is oriented to person, place, and time. She appears well-developed and well-nourished.  Non-toxic appearance.  HENT:  Head: Normocephalic.  Right Ear: Tympanic membrane and external ear normal.  Left Ear: Tympanic membrane and external ear normal.  Nasal congestion present. No pain to percussion over the sinuses.  Eyes: EOM and lids are normal. Pupils are equal, round, and reactive to light.  Neck: Normal range of motion. Neck supple. Carotid bruit is not present.  Cardiovascular: Normal rate, regular rhythm, normal heart sounds, intact  distal pulses and normal pulses.   Pulmonary/Chest: Breath sounds normal. No respiratory distress.  Course breath sounds, no focal consolidation. Symmetrical rise and fall of the chest.  There is soreness to the right lateral chest wall just below the lateral ribs. There is no mass appreciated. No pulsatile mass appreciated. No hot areas.  Abdominal: Soft. Bowel sounds are normal. She exhibits no mass. There is no tenderness. There is no guarding.  Musculoskeletal: Normal range of motion.  Lymphadenopathy:       Head (right side): No submandibular adenopathy present.       Head (left side): No submandibular adenopathy present.    She has no cervical adenopathy.  Neurological: She is alert and oriented to person, place, and time. She has normal strength. No cranial nerve deficit or sensory deficit.  Skin: Skin is warm and dry.  Psychiatric: She has a normal mood and affect. Her speech is normal.    ED Course  Procedures (including critical care time) Labs Reviewed  URINALYSIS, ROUTINE W REFLEX MICROSCOPIC   No results found. No diagnosis found. Pulse oximetry 99% on room air. Within normal limits by my interpretation. MDM  *I have reviewed nursing notes, vital signs, and all appropriate lab and imaging results for this patient.**  Pt ambulatory without problem. No distress.  Chest xray is negtive for acute changes. Vital signs wnl. Pulse ox 99% on room air. Rx for sudafed and norco given to the patient.  Kathie Dike, PA-C 08/21/12 2157

## 2012-08-27 NOTE — ED Provider Notes (Signed)
Medical screening examination/treatment/procedure(s) were performed by non-physician practitioner and as supervising physician I was immediately available for consultation/collaboration.  Hari Casaus, MD 08/27/12 0716 

## 2012-12-28 ENCOUNTER — Other Ambulatory Visit: Payer: Self-pay

## 2013-12-24 ENCOUNTER — Encounter (HOSPITAL_COMMUNITY): Payer: Self-pay | Admitting: Emergency Medicine

## 2013-12-25 IMAGING — CR DG CHEST 2V
2 series · 2 of 2 positions shown · non-contrast
Comparison: None.

CLINICAL DATA: Cough with nasal congestion.

CHEST - 2 VIEW

[view not recorded (1 of 2)]
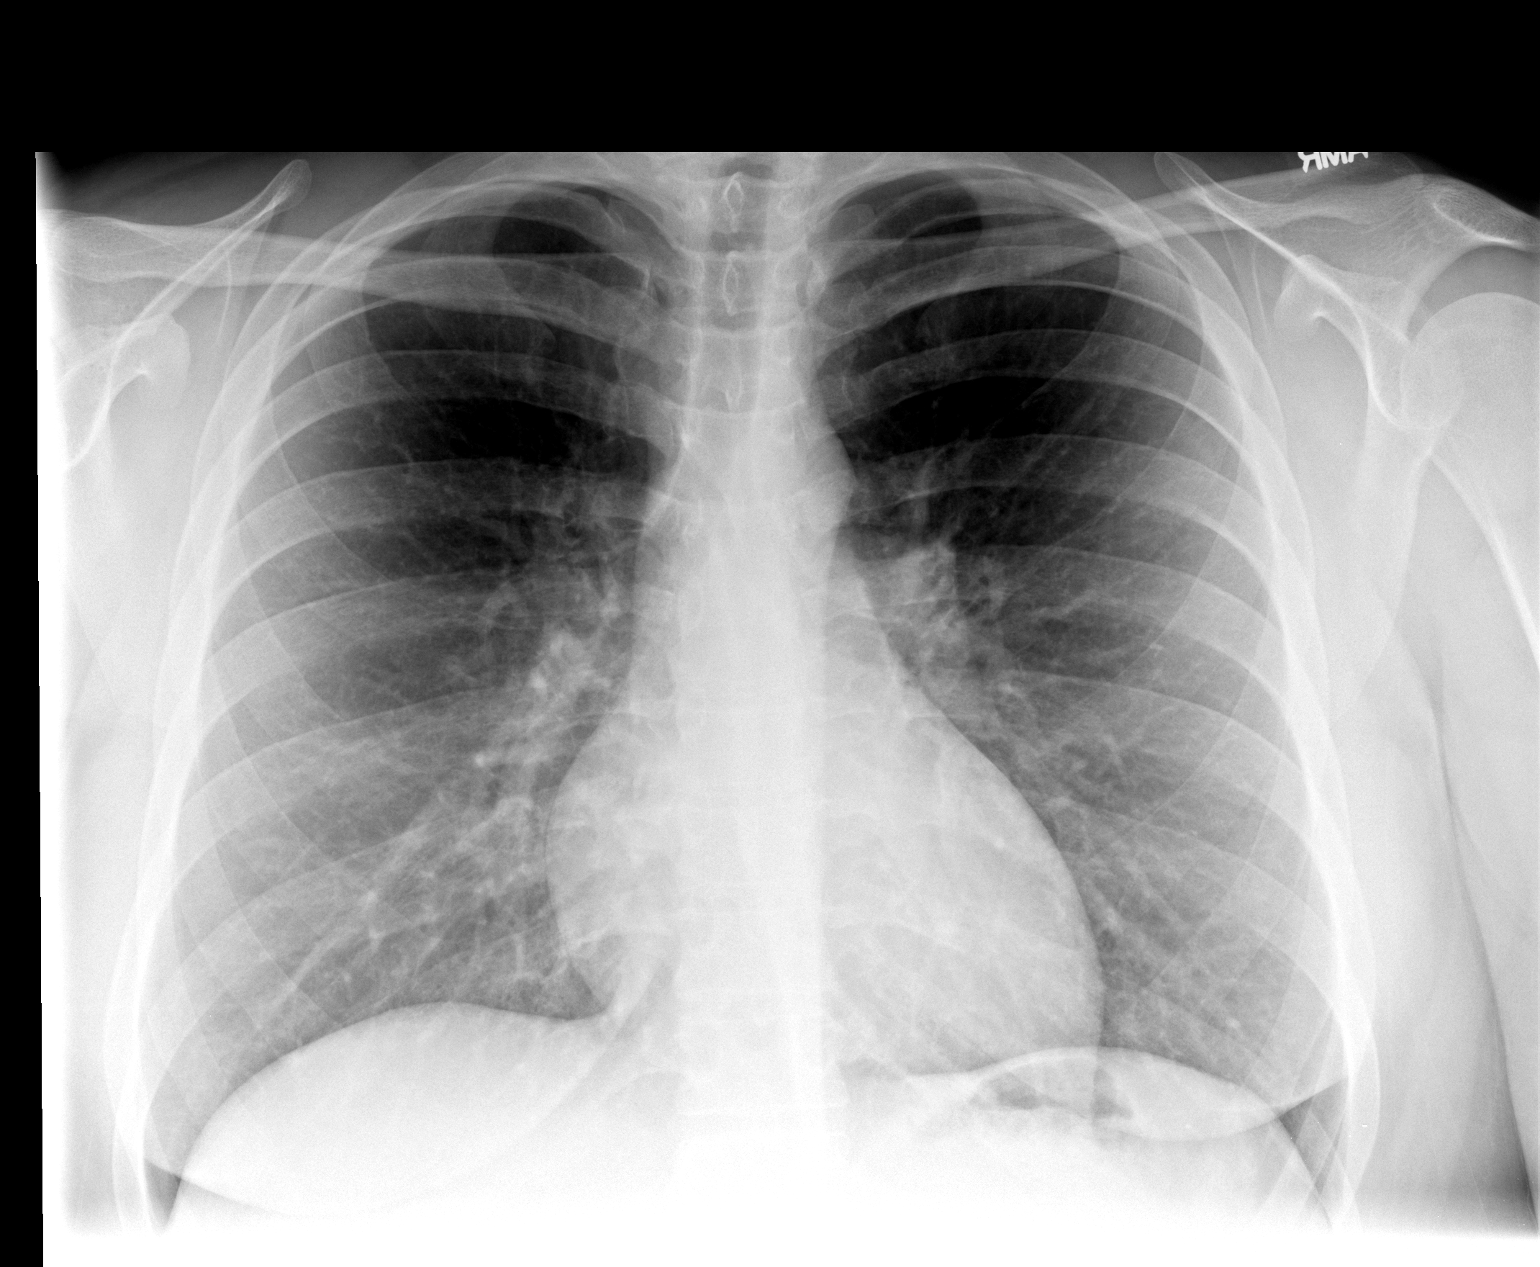

[view not recorded (2 of 2)]
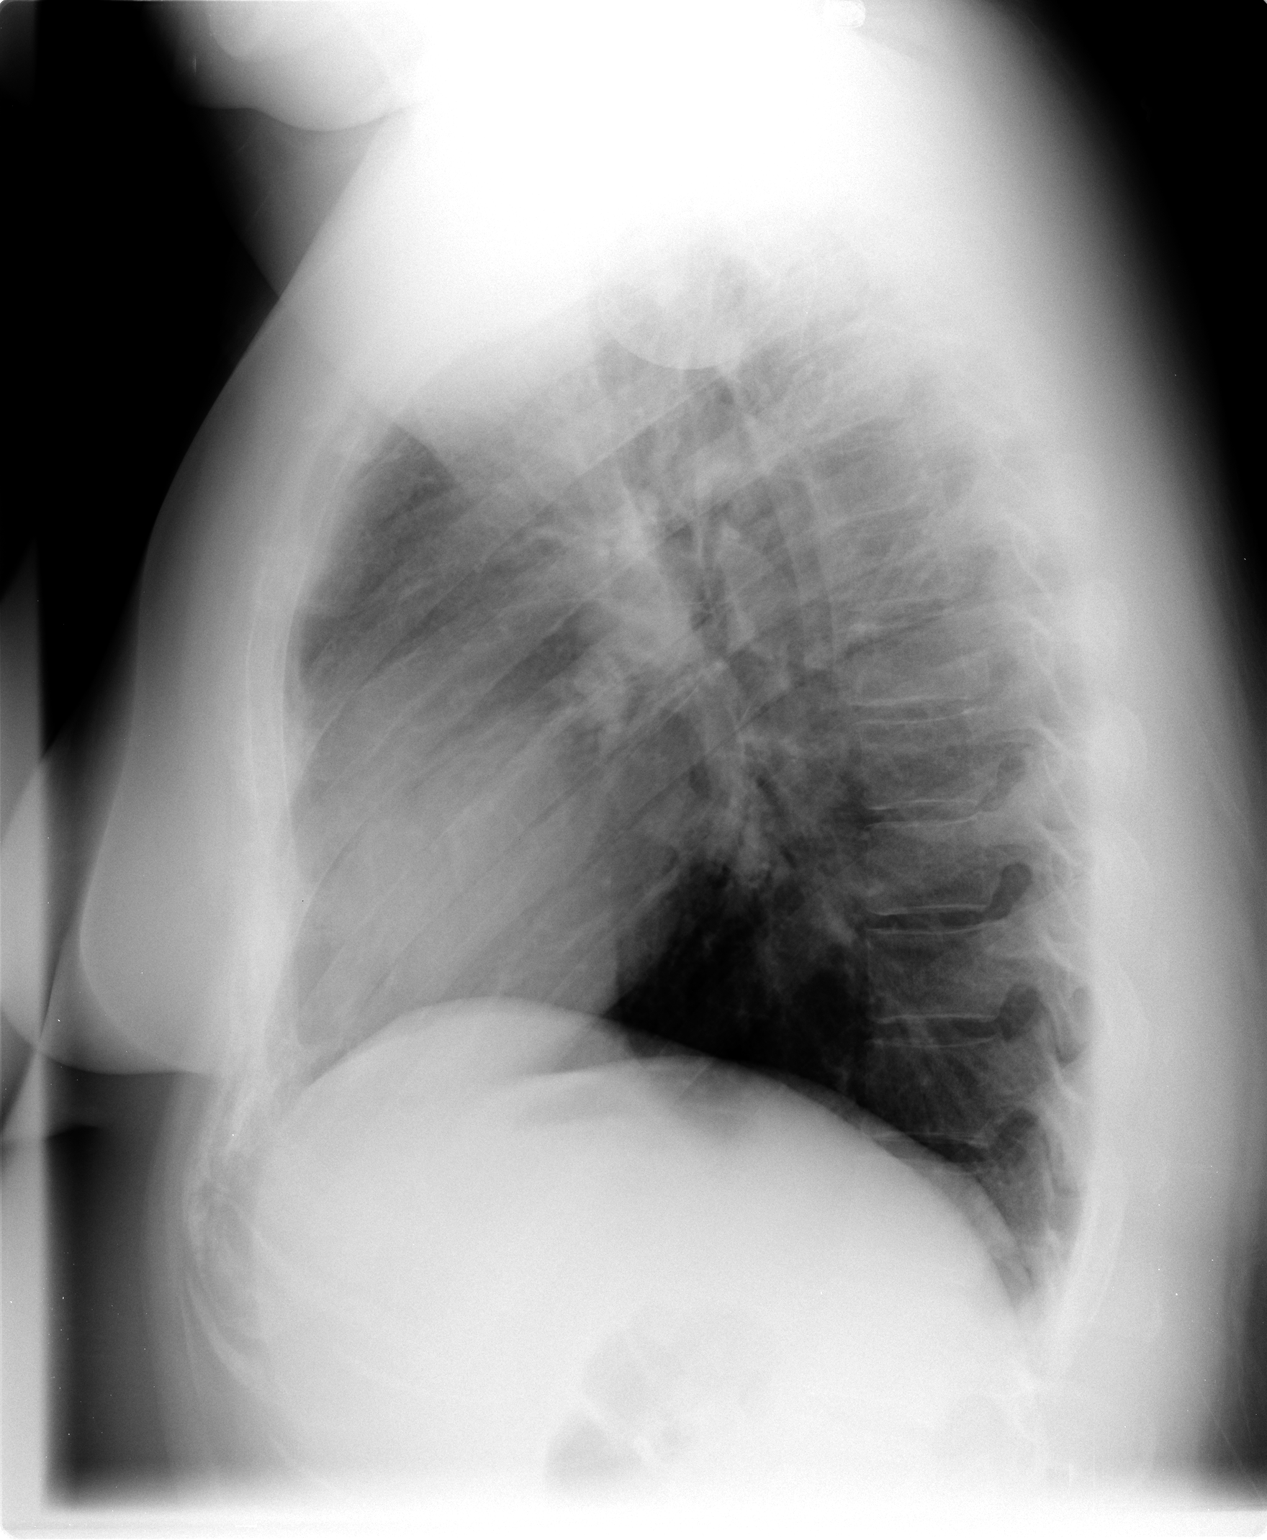

[2 of 2 positions shown; findings below may reference images not displayed]

FINDINGS: The lungs are clear without focal infiltrate, edema,
pneumothorax or pleural effusion. The cardiopericardial silhouette
is within normal limits for size. Imaged bony structures of the
thorax are intact.
IMPRESSION: Normal exam.

## 2014-08-31 ENCOUNTER — Emergency Department
Admission: EM | Admit: 2014-08-31 | Discharge: 2014-08-31 | Disposition: A | Discharge status: Home / Self Care | Payer: Self-pay | Attending: Emergency Medicine | Admitting: Emergency Medicine

## 2014-08-31 ENCOUNTER — Other Ambulatory Visit: Payer: Self-pay

## 2014-08-31 ENCOUNTER — Encounter: Payer: Self-pay | Admitting: General Practice

## 2014-08-31 DIAGNOSIS — F410 Panic disorder [episodic paroxysmal anxiety] without agoraphobia: Secondary | ICD-10-CM

## 2014-08-31 DIAGNOSIS — R0789 Other chest pain: Secondary | ICD-10-CM | POA: Insufficient documentation

## 2014-08-31 DIAGNOSIS — G441 Vascular headache, not elsewhere classified: Secondary | ICD-10-CM

## 2014-08-31 DIAGNOSIS — R0602 Shortness of breath: Secondary | ICD-10-CM | POA: Insufficient documentation

## 2014-08-31 DIAGNOSIS — Z720 Tobacco use: Secondary | ICD-10-CM | POA: Insufficient documentation

## 2014-08-31 LAB — CBC
HCT: 40.5 % (ref 35.0–47.0)
Hemoglobin: 14 g/dL (ref 12.0–16.0)
MCH: 31.8 pg (ref 26.0–34.0)
MCHC: 34.5 g/dL (ref 32.0–36.0)
MCV: 92.1 fL (ref 80.0–100.0)
PLATELETS: 246 10*3/uL (ref 150–440)
RBC: 4.4 MIL/uL (ref 3.80–5.20)
RDW: 12.5 % (ref 11.5–14.5)
WBC: 7.3 10*3/uL (ref 3.6–11.0)

## 2014-08-31 LAB — BASIC METABOLIC PANEL
ANION GAP: 12 (ref 5–15)
BUN: 13 mg/dL (ref 6–20)
CHLORIDE: 104 mmol/L (ref 101–111)
CO2: 20 mmol/L — AB (ref 22–32)
CREATININE: 0.83 mg/dL (ref 0.44–1.00)
Calcium: 9.8 mg/dL (ref 8.9–10.3)
GFR calc non Af Amer: >60 mL/min (ref >60)
Glucose, Bld: 137 mg/dL — ABNORMAL HIGH (ref 65–99)
POTASSIUM: 3.6 mmol/L (ref 3.5–5.1)
Sodium: 136 mmol/L (ref 135–145)

## 2014-08-31 LAB — TROPONIN I
Troponin I: <0.03 ng/mL (ref <0.031)
Troponin I: <0.03 ng/mL (ref <0.031)

## 2014-08-31 MED ORDER — ACETAMINOPHEN-CODEINE #3 300-30 MG PO TABS
1.0000 | ORAL_TABLET | Freq: Three times a day (TID) | ORAL | Status: DC | PRN
Start: 2014-08-31 — End: 2016-03-23

## 2014-08-31 MED ORDER — KETOROLAC TROMETHAMINE 10 MG PO TABS
ORAL_TABLET | ORAL | Status: AC
  Administered 2014-08-31: 20 mg via ORAL
  Filled 2014-08-31: qty 2

## 2014-08-31 MED ORDER — KETOROLAC TROMETHAMINE 10 MG PO TABS
20.0000 mg | ORAL_TABLET | Freq: Once | ORAL | Status: AC
Start: 2014-08-31 — End: 2014-08-31
  Administered 2014-08-31: 20 mg via ORAL

## 2014-08-31 MED ORDER — CYCLOBENZAPRINE HCL 10 MG PO TABS
5.0000 mg | ORAL_TABLET | Freq: Once | ORAL | Status: AC
Start: 2014-08-31 — End: 2014-08-31
  Administered 2014-08-31: 5 mg via ORAL

## 2014-08-31 MED ORDER — CYCLOBENZAPRINE HCL 10 MG PO TABS
ORAL_TABLET | ORAL | Status: AC
  Administered 2014-08-31: 5 mg via ORAL
  Filled 2014-08-31: qty 1

## 2014-08-31 NOTE — ED Notes (Signed)
Pt. Arrived to ed from home with reports of experiencing chest tightness x days. Pt reports today she started experiencing SOB and headache. Pt alert and oriented, anxious in triage. Pt states "i think im going to pass out". Pt. Denies cold symptoms.

## 2014-08-31 NOTE — Discharge Instructions (Signed)
Panic Attacks °Panic attacks are sudden, short-lived surges of severe anxiety, fear, or discomfort. They may occur for no reason when you are relaxed, when you are anxious, or when you are sleeping. Panic attacks may occur for a number of reasons:  °· Healthy people occasionally have panic attacks in extreme, life-threatening situations, such as war or natural disasters. Normal anxiety is a protective mechanism of the body that helps us react to danger (fight or flight response). °· Panic attacks are often seen with anxiety disorders, such as panic disorder, social anxiety disorder, generalized anxiety disorder, and phobias. Anxiety disorders cause excessive or uncontrollable anxiety. They may interfere with your relationships or other life activities. °· Panic attacks are sometimes seen with other mental illnesses, such as depression and posttraumatic stress disorder. °· Certain medical conditions, prescription medicines, and drugs of abuse can cause panic attacks. °SYMPTOMS  °Panic attacks start suddenly, peak within 20 minutes, and are accompanied by four or more of the following symptoms: °· Pounding heart or fast heart rate (palpitations). °· Sweating. °· Trembling or shaking. °· Shortness of breath or feeling smothered. °· Feeling choked. °· Chest pain or discomfort. °· Nausea or strange feeling in your stomach. °· Dizziness, light-headedness, or feeling like you will faint. °· Chills or hot flushes. °· Numbness or tingling in your lips or hands and feet. °· Feeling that things are not real or feeling that you are not yourself. °· Fear of losing control or going crazy. °· Fear of dying. °Some of these symptoms can mimic serious medical conditions. For example, you may think you are having a heart attack. Although panic attacks can be very scary, they are not life threatening. °DIAGNOSIS  °Panic attacks are diagnosed through an assessment by your health care provider. Your health care provider will ask  questions about your symptoms, such as where and when they occurred. Your health care provider will also ask about your medical history and use of alcohol and drugs, including prescription medicines. Your health care provider may order blood tests or other studies to rule out a serious medical condition. Your health care provider may refer you to a mental health professional for further evaluation. °TREATMENT  °· Most healthy people who have one or two panic attacks in an extreme, life-threatening situation will not require treatment. °· The treatment for panic attacks associated with anxiety disorders or other mental illness typically involves counseling with a mental health professional, medicine, or a combination of both. Your health care provider will help determine what treatment is best for you. °· Panic attacks due to physical illness usually go away with treatment of the illness. If prescription medicine is causing panic attacks, talk with your health care provider about stopping the medicine, decreasing the dose, or substituting another medicine. °· Panic attacks due to alcohol or drug abuse go away with abstinence. Some adults need professional help in order to stop drinking or using drugs. °HOME CARE INSTRUCTIONS  °· Take all medicines as directed by your health care provider.   °· Schedule and attend follow-up visits as directed by your health care provider. It is important to keep all your appointments. °SEEK MEDICAL CARE IF: °· You are not able to take your medicines as prescribed. °· Your symptoms do not improve or get worse. °SEEK IMMEDIATE MEDICAL CARE IF:  °· You experience panic attack symptoms that are different than your usual symptoms. °· You have serious thoughts about hurting yourself or others. °· You are taking medicine for panic attacks and   have a serious side effect. MAKE SURE YOU:  Understand these instructions.  Will watch your condition.  Will get help right away if you are not  doing well or get worse. Document Released: 02/08/2005 Document Revised: 02/13/2013 Document Reviewed: 09/22/2012 [facility] Patient Information 2015 [place], [place]. This information is not intended to replace advice given to you by your health care provider. Make sure you discuss any questions you have with your health care provider.  General Headache Without Cause A general headache is pain or discomfort felt around the head or neck area. The cause may not be found.  HOME CARE   Keep all doctor visits.  Only take medicines as told by your doctor.  Lie down in a dark, quiet room when you have a headache.  Keep a journal to find out if certain things bring on headaches. For example, write down:  What you eat and drink.  How much sleep you get.  Any change to your diet or medicines.  Relax by getting a massage or doing other relaxing activities.  Put ice or heat packs on the head and neck area as told by your doctor.  Lessen stress.  Sit up straight. Do not tighten (tense) your muscles.  Quit smoking if you smoke.  Lessen how much alcohol you drink.  Lessen how much caffeine you drink, or stop drinking caffeine.  Eat and sleep on a regular schedule.  Get 7 to 9 hours of sleep, or as told by your doctor.  Keep lights dim if bright lights bother you or make your headaches worse. GET HELP RIGHT AWAY IF:   Your headache becomes really bad.  You have a fever.  You have a stiff neck.  You have trouble seeing.  Your muscles are weak, or you lose muscle control.  You lose your balance or have trouble walking.  You feel like you will pass out (faint), or you pass out.  You have really bad symptoms that are different than your first symptoms.  You have problems with the medicines given to you by your doctor.  Your medicines do not work.  Your headache feels different than the other headaches.  You feel sick to your stomach (nauseous) or throw up (vomit). MAKE  SURE YOU:   Understand these instructions.  Will watch your condition.  Will get help right away if you are not doing well or get worse. Document Released: 11/18/2007 Document Revised: 05/03/2011 Document Reviewed: 01/29/2011 [facility] Patient Information 2015 [place], [place]. This information is not intended to replace advice given to you by your health care provider. Make sure you discuss any questions you have with your health care provider.  Take the prescription Tylenol #3 as needed for headache pain.  [facility]

## 2014-08-31 NOTE — ED Notes (Signed)
Results reviewed and triage nurse consulted re patient presentation. Pt moved to flex.

## 2014-08-31 NOTE — ED Provider Notes (Signed)
[facility] Emergency Department Provider Note ____________________________________________  Time seen: 1515  I have reviewed the triage vital signs and the nursing notes.  HISTORY  Chief Complaint  Chest Pain; Shortness of Breath; and Headache  HPI Lisa Rich is a 29 y.o. female reports to the ED for evaluation and management of fleeting heart palpitations, shortness of breath, and "just feeling weird." She reports she had after eating, experienced several seconds of heart fluttering while sitting on the couch this afternoon about 1:30. She has had previous episodes of these palpitations over the last several months and years, but none have been significant enough to require her to seek care for treatment. She came in today primarily because of the headache and tension in the head that has continued. She has been able to calm her breathing, and feels less anxious overall; she also reports resolution of her lip and hand tingling.She denies nausea, vomiting, recent illness, vision change, or syncope.  Past Medical History  Diagnosis Date  . Abnormal Pap smear 2007  . Pregnancy induced hypertension 2008    preclampsia  . Depression     Patient Active Problem List   Diagnosis Date Noted  . Hx of preeclampsia, prior pregnancy, currently pregnant 04/19/2011  . Obesity complicating pregnancy, childbirth, or the puerperium, antepartum condition or complication(649.13) 01/11/2011    Past Surgical History  Procedure Laterality Date  . Wisdom tooth extraction  2011     x 4    Current Outpatient Rx  Name  Route  Sig  Dispense  Refill  . HYDROcodone-acetaminophen (NORCO/VICODIN) 5-325 MG per tablet   Oral   Take 1 tablet by mouth every 4 (four) hours as needed for pain.   15 tablet   0   . ibuprofen (ADVIL,MOTRIN) 200 MG tablet   Oral   Take 200 mg by mouth daily as needed for pain.         . pseudoephedrine (SUDAFED) 60 MG tablet   Oral   Take 1  tablet (60 mg total) by mouth every 6 (six) hours as needed for congestion.   30 tablet   0    Allergies Review of patient's allergies indicates no known allergies.  Family History  Problem Relation Age of Onset  . Hypertension Mother   . Depression Mother   . Hypertension Father   . Bipolar disorder Father   . Diabetes Maternal Grandmother   . Asthma Maternal Grandmother   . Heart disease Maternal Grandmother     CHF  . Heart disease Maternal Grandfather     CHF   Social History History  Substance Use Topics  . Smoking status: Current Every Day Smoker -- 0.50 packs/day    Types: Cigarettes  . Smokeless tobacco: Never Used  . Alcohol Use: No   Review of Systems  Constitutional: Negative for fever. Eyes: Negative for visual changes. ENT: Negative for sore throat. Cardiovascular: Negative for chest pain. Respiratory: Negative for shortness of breath. Gastrointestinal: Negative for abdominal pain, vomiting and diarrhea. Genitourinary: Negative for dysuria. Musculoskeletal: Negative for back pain. Skin: Negative for rash. Neurological: Negative for headaches, focal weakness or numbness. ____________________________________________  PHYSICAL EXAM:  VITAL SIGNS: ED Triage Vitals  Enc Vitals Group     BP 08/31/14 1415 147/83 mmHg     Pulse Rate 08/31/14 1415 98     Resp 08/31/14 1415 22     Temp 08/31/14 1415 98.1 F (36.7 C)     Temp Source 08/31/14 1415 Oral  SpO2 08/31/14 1415 100 %     Weight 08/31/14 1415 250 lb (113.399 kg)     Height 08/31/14 1415 5\' 9"  (1.753 m)     Head Cir --      Peak Flow --      Pain Score 08/31/14 1416 7     Pain Loc --      Pain Edu? --      Excl. in GC? --    Constitutional: Alert and oriented. Well appearing and in no distress. Eyes: Conjunctivae are normal. PERRL. Normal extraocular movements. ENT   Head: Normocephalic and atraumatic.   Nose: No congestion/rhinnorhea.   Mouth/Throat: Mucous membranes are  moist.   Neck: Supple. No thyromegaly. Hematological/Lymphatic/Immunilogical: No cervical lymphadenopathy. Cardiovascular: Normal rate, regular rhythm.  Respiratory: Normal respiratory effort. No wheezes/rales/rhonchi. Gastrointestinal: Soft and nontender. No distention. Musculoskeletal: Nontender with normal range of motion in all extremities.  Neurologic:  Normal gait without ataxia. Normal speech and language. No gross focal neurologic deficits are appreciated. Skin:  Skin is warm, dry and intact. No rash noted. Psychiatric: Mood and affect are normal. Patient exhibits appropriate insight and judgment. ____________________________________________    LABS (pertinent positives/negatives) Labs Reviewed  BASIC METABOLIC PANEL - Abnormal; Notable for the following:    CO2 20 (*)    Glucose, Bld 137 (*)    All other components within normal limits  CBC  TROPONIN I  TROPONIN I  ____________________________________________  ED ECG REPORT   Date: 08/31/2014  EKG Time: 1420  Rate:87  Rhythm: normal EKG, normal sinus rhythm, there are no previous tracings available for comparison  Axis: normal  Intervals:none  ST&T Change: none  Narrative Interpretation: sinus rhythym ____________________________________________  PROCEDURES  Toradol 20 mg PO Flexeril 5 mg PO ____________________________________________  INITIAL IMPRESSION / ASSESSMENT AND PLAN / ED COURSE  WNL labs, normal troponin x 2, exam, and EKG give clinical presentation consistent with likely panic attack. Acute treatment of headache to resolution. Home prescription for Tylenol #3 (15) for continued headache pain relief.  [facility]  ____________________________________________  FINAL CLINICAL IMPRESSION(S) / ED DIAGNOSES  Final diagnoses:  Panic attack  Other vascular headache     Lisa HoardJenise V Rich Tavio Biegel, PA-C 08/31/14 1702  Lisa Roseory Forbach,  MD 08/31/14 2329

## 2016-03-23 ENCOUNTER — Encounter: Payer: Self-pay | Admitting: Family Medicine

## 2016-03-23 ENCOUNTER — Ambulatory Visit (INDEPENDENT_AMBULATORY_CARE_PROVIDER_SITE_OTHER): Payer: 59 | Admitting: Family Medicine

## 2016-03-23 VITALS — BP 123/82 | HR 76 | Resp 18 | Ht 69.0 in | Wt 240.0 lb

## 2016-03-23 DIAGNOSIS — Z124 Encounter for screening for malignant neoplasm of cervix: Secondary | ICD-10-CM | POA: Diagnosis not present

## 2016-03-23 DIAGNOSIS — Z30433 Encounter for removal and reinsertion of intrauterine contraceptive device: Secondary | ICD-10-CM | POA: Diagnosis not present

## 2016-03-23 MED ORDER — LEVONORGESTREL 18.6 MCG/DAY IU IUD
INTRAUTERINE_SYSTEM | Freq: Once | INTRAUTERINE | Status: AC
  Administered 2016-03-23: 14:00:00 via INTRAUTERINE

## 2016-03-23 NOTE — Progress Notes (Signed)
   Subjective: Patient here for IUD change.  Has been in for 5 years and due to be changed. She has no cycles and desires to continue [place] IUD for contraception. No pap for 5 years.  Objective: Vitals:   03/23/16 1322  BP: 123/82  Pulse: 76  Resp: 18    Patient identified, informed consent performed, signed copy in chart, time out was performed Procedure: Speculum placed inside vagina.  Cervix visualized.  Strings grasped with ring forceps.  IUD removed intact.  Procedure: Cervix visualized.  Cleaned with Betadine x 2.  Grasped anteriourly with a single tooth tenaculum.  Uterus sounded to 8 cm.  Liletta IUD placed per manufacturer's recommendations.  Strings trimmed to 3 cm.   Patient given post procedure instructions and Liletta care card with expiration date.  Patient is asked to check IUD strings periodically and follow up in 4-6 weeks for IUD check.  Impression: Encounter for IUD removal and reinsertion  Screening for cervical cancer - Plan: Cytology - PAP   Plan: Continue IUD for 5 years. Declined STD screen and flu shot. Declined annual blood work including cholesterol.

## 2016-03-23 NOTE — Patient Instructions (Signed)
Preventive Care 18-39 Years, Female Preventive care refers to lifestyle choices and visits with your health care provider that can promote health and wellness. What does preventive care include?  A yearly physical exam. This is also called an annual well check.  Dental exams once or twice a year.  Routine eye exams. Ask your health care provider how often you should have your eyes checked.  Personal lifestyle choices, including:  Daily care of your teeth and gums.  Regular physical activity.  Eating a healthy diet.  Avoiding tobacco and drug use.  Limiting alcohol use.  Practicing safe sex.  Taking vitamin and mineral supplements as recommended by your health care provider. What happens during an annual well check? The services and screenings done by your health care provider during your annual well check will depend on your age, overall health, lifestyle risk factors, and family history of disease. Counseling  Your health care provider may ask you questions about your:  Alcohol use.  Tobacco use.  Drug use.  Emotional well-being.  Home and relationship well-being.  Sexual activity.  Eating habits.  Work and work environment.  Method of birth control.  Menstrual cycle.  Pregnancy history. Screening  You may have the following tests or measurements:  Height, weight, and BMI.  Diabetes screening. This is done by checking your blood sugar (glucose) after you have not eaten for a while (fasting).  Blood pressure.  Lipid and cholesterol levels. These may be checked every 5 years starting at age 20.  Skin check.  Hepatitis C blood test.  Hepatitis B blood test.  Sexually transmitted disease (STD) testing.  BRCA-related cancer screening. This may be done if you have a family history of breast, ovarian, tubal, or peritoneal cancers.  Pelvic exam and Pap test. This may be done every 3 years starting at age 21. Starting at age 30, this may be done every 5  years if you have a Pap test in combination with an HPV test. Discuss your test results, treatment options, and if necessary, the need for more tests with your health care provider. Vaccines  Your health care provider may recommend certain vaccines, such as:  Influenza vaccine. This is recommended every year.  Tetanus, diphtheria, and acellular pertussis (Tdap, Td) vaccine. You may need a Td booster every 10 years.  Varicella vaccine. You may need this if you have not been vaccinated.  HPV vaccine. If you are 26 or younger, you may need three doses over 6 months.  Measles, mumps, and rubella (MMR) vaccine. You may need at least one dose of MMR. You may also need a second dose.  Pneumococcal 13-valent conjugate (PCV13) vaccine. You may need this if you have certain conditions and were not previously vaccinated.  Pneumococcal polysaccharide (PPSV23) vaccine. You may need one or two doses if you smoke cigarettes or if you have certain conditions.  Meningococcal vaccine. One dose is recommended if you are age 19-21 years and a first-year college student living in a residence hall, or if you have one of several medical conditions. You may also need additional booster doses.  Hepatitis A vaccine. You may need this if you have certain conditions or if you travel or work in places where you may be exposed to hepatitis A.  Hepatitis B vaccine. You may need this if you have certain conditions or if you travel or work in places where you may be exposed to hepatitis B.  Haemophilus influenzae type b (Hib) vaccine. You may need this   if you have certain risk factors. Talk to your health care provider about which screenings and vaccines you need and how often you need them. This information is not intended to replace advice given to you by your health care provider. Make sure you discuss any questions you have with your health care provider. Document Released: 04/06/2001 Document Revised: 10/29/2015  Document Reviewed: 12/10/2014 Elsevier Interactive Patient Education  2017 [employer]. Levonorgestrel intrauterine device (IUD) What is this medicine? LEVONORGESTREL IUD (LEE voe nor jes trel) is a contraceptive (birth control) device. The device is placed inside the uterus by a healthcare professional. It is used to prevent pregnancy. This device can also be used to treat heavy bleeding that occurs during your period. This medicine may be used for other purposes; ask your health care provider or pharmacist if you have questions. COMMON BRAND NAME(S): Minette Headland What should I tell my health care provider before I take this medicine? They need to know if you have any of these conditions: -abnormal Pap smear -cancer of the breast, uterus, or cervix -diabetes -endometritis -genital or pelvic infection now or in the past -have more than one sexual partner or your partner has more than one partner -heart disease -history of an ectopic or tubal pregnancy -immune system problems -IUD in place -liver disease or tumor -problems with blood clots or take blood-thinners -seizures -use intravenous drugs -uterus of unusual shape -vaginal bleeding that has not been explained -an unusual or allergic reaction to levonorgestrel, other hormones, silicone, or polyethylene, medicines, foods, dyes, or preservatives -pregnant or trying to get pregnant -breast-feeding How should I use this medicine? This device is placed inside the uterus by a health care professional. Talk to your pediatrician regarding the use of this medicine in children. Special care may be needed. Overdosage: If you think you have taken too much of this medicine contact a poison control center or emergency room at once. NOTE: This medicine is only for you. Do not share this medicine with others. What if I miss a dose? This does not apply. Depending on the brand of device you have inserted, the device will need  to be replaced every 3 to 5 years if you wish to continue using this type of birth control. What may interact with this medicine? Do not take this medicine with any of the following medications: -amprenavir -bosentan -fosamprenavir This medicine may also interact with the following medications: -aprepitant -barbiturate medicines for inducing sleep or treating seizures -bexarotene -griseofulvin -medicines to treat seizures like carbamazepine, ethotoin, felbamate, oxcarbazepine, phenytoin, topiramate -modafinil -pioglitazone -rifabutin -rifampin -rifapentine -some medicines to treat HIV infection like atazanavir, indinavir, lopinavir, nelfinavir, tipranavir, ritonavir -St. John's wort -warfarin This list may not describe all possible interactions. Give your health care provider a list of all the medicines, herbs, non-prescription drugs, or dietary supplements you use. Also tell them if you smoke, drink alcohol, or use illegal drugs. Some items may interact with your medicine. What should I watch for while using this medicine? Visit your doctor or health care professional for regular check ups. See your doctor if you or your partner has sexual contact with others, becomes HIV positive, or gets a sexual transmitted disease. This product does not protect you against HIV infection (AIDS) or other sexually transmitted diseases. You can check the placement of the IUD yourself by reaching up to the top of your vagina with clean fingers to feel the threads. Do not pull on the threads. It is a good  habit to check placement after each menstrual period. Call your doctor right away if you feel more of the IUD than just the threads or if you cannot feel the threads at all. The IUD may come out by itself. You may become pregnant if the device comes out. If you notice that the IUD has come out use a backup birth control method like condoms and call your health care provider. Using tampons will not change  the position of the IUD and are okay to use during your period. This IUD can be safely scanned with magnetic resonance imaging (MRI) only under specific conditions. Before you have an MRI, tell your healthcare provider that you have an IUD in place, and which type of IUD you have in place. What side effects may I notice from receiving this medicine? Side effects that you should report to your doctor or health care professional as soon as possible: -allergic reactions like skin rash, itching or hives, swelling of the face, lips, or tongue -fever, flu-like symptoms -genital sores -high blood pressure -no menstrual period for 6 weeks during use -pain, swelling, warmth in the leg -pelvic pain or tenderness -severe or sudden headache -signs of pregnancy -stomach cramping -sudden shortness of breath -trouble with balance, talking, or walking -unusual vaginal bleeding, discharge -yellowing of the eyes or skin Side effects that usually do not require medical attention (report to your doctor or health care professional if they continue or are bothersome): -acne -breast pain -change in sex drive or performance -changes in weight -cramping, dizziness, or faintness while the device is being inserted -headache -irregular menstrual bleeding within first 3 to 6 months of use -nausea This list may not describe all possible side effects. Call your doctor for medical advice about side effects. You may report side effects to FDA at 1-800-FDA-1088. Where should I keep my medicine? This does not apply. NOTE: This sheet is a summary. It may not cover all possible information. If you have questions about this medicine, talk to your doctor, pharmacist, or health care provider.  2017 Elsevier/Gold Standard (2015-08-01 13:46:37)

## 2016-03-24 ENCOUNTER — Encounter: Payer: Self-pay | Admitting: *Deleted

## 2016-03-24 LAB — CYTOLOGY - PAP: DIAGNOSIS: NEGATIVE

## 2016-04-28 DIAGNOSIS — B360 Pityriasis versicolor: Secondary | ICD-10-CM | POA: Diagnosis not present

## 2016-04-28 DIAGNOSIS — E559 Vitamin D deficiency, unspecified: Secondary | ICD-10-CM | POA: Diagnosis not present

## 2016-04-28 DIAGNOSIS — R5383 Other fatigue: Secondary | ICD-10-CM | POA: Diagnosis not present

## 2016-04-28 DIAGNOSIS — L539 Erythematous condition, unspecified: Secondary | ICD-10-CM | POA: Diagnosis not present

## 2016-04-28 DIAGNOSIS — R0600 Dyspnea, unspecified: Secondary | ICD-10-CM | POA: Diagnosis not present

## 2016-05-31 DIAGNOSIS — E559 Vitamin D deficiency, unspecified: Secondary | ICD-10-CM | POA: Diagnosis not present

## 2016-05-31 DIAGNOSIS — R0600 Dyspnea, unspecified: Secondary | ICD-10-CM | POA: Diagnosis not present

## 2016-06-16 DIAGNOSIS — R1013 Epigastric pain: Secondary | ICD-10-CM | POA: Diagnosis not present

## 2016-06-16 DIAGNOSIS — M545 Low back pain: Secondary | ICD-10-CM | POA: Diagnosis not present

## 2016-06-16 DIAGNOSIS — R0600 Dyspnea, unspecified: Secondary | ICD-10-CM | POA: Diagnosis not present

## 2017-05-04 DIAGNOSIS — I10 Essential (primary) hypertension: Secondary | ICD-10-CM | POA: Diagnosis not present

## 2017-05-04 DIAGNOSIS — R631 Polydipsia: Secondary | ICD-10-CM | POA: Diagnosis not present

## 2017-05-31 DIAGNOSIS — R631 Polydipsia: Secondary | ICD-10-CM | POA: Diagnosis not present

## 2017-05-31 DIAGNOSIS — Z0000 Encounter for general adult medical examination without abnormal findings: Secondary | ICD-10-CM | POA: Diagnosis not present

## 2017-12-07 DIAGNOSIS — N393 Stress incontinence (female) (male): Secondary | ICD-10-CM | POA: Diagnosis not present

## 2018-01-03 ENCOUNTER — Ambulatory Visit: Payer: 59 | Admitting: Family Medicine

## 2018-01-10 ENCOUNTER — Encounter: Payer: Self-pay | Admitting: Family Medicine

## 2018-01-10 ENCOUNTER — Other Ambulatory Visit: Payer: Self-pay

## 2018-01-10 ENCOUNTER — Ambulatory Visit (INDEPENDENT_AMBULATORY_CARE_PROVIDER_SITE_OTHER): Payer: 59 | Admitting: Family Medicine

## 2018-01-10 VITALS — BP 124/85 | HR 72 | Wt 259.2 lb

## 2018-01-10 DIAGNOSIS — R300 Dysuria: Secondary | ICD-10-CM | POA: Diagnosis not present

## 2018-01-10 DIAGNOSIS — N3943 Post-void dribbling: Secondary | ICD-10-CM | POA: Diagnosis not present

## 2018-01-10 DIAGNOSIS — R32 Unspecified urinary incontinence: Secondary | ICD-10-CM | POA: Insufficient documentation

## 2018-01-10 LAB — POCT URINALYSIS DIPSTICK
GLUCOSE UA: NEGATIVE
LEUKOCYTES UA: NEGATIVE
PROTEIN UA: NEGATIVE
SPEC GRAV UA: 1.015 (ref 1.010–1.025)
Urobilinogen, UA: 0.2 E.U./dL

## 2018-01-10 NOTE — Progress Notes (Signed)
   Subjective:    Patient ID: Lisa Rich is a 32 y.o. female presenting with Discuss urine  on 01/10/2018  HPI: Here today for issues with bladder leaking. Leaking after coughing and sneezing mostly. P2, Largest baby was 9 lb 6 oz born via SVD.  Notes that she is continuing to leak following bladder emptying. Has done Kegel's and notes this continues to be an issue. Has noticed significant worsening x 6 months. Notes underwear has urine smell. She has IUD and reports that she had completed child bearing.  Review of Systems  Constitutional: Negative for chills and fever.  Respiratory: Negative for shortness of breath.   Cardiovascular: Negative for chest pain.  Gastrointestinal: Negative for abdominal pain, nausea and vomiting.  Genitourinary: Negative for dysuria.  Skin: Negative for rash.      Objective:    BP 124/85   Pulse 72   Wt 259 lb 3.2 oz (117.6 kg)   BMI 38.28 kg/m  Physical Exam  Constitutional: She is oriented to person, place, and time. She appears well-developed and well-nourished. No distress.  HENT:  Head: Normocephalic and atraumatic.  Eyes: No scleral icterus.  Neck: Neck supple.  Cardiovascular: Normal rate.  Pulmonary/Chest: Effort normal.  Abdominal: Soft.  Genitourinary:  Genitourinary Comments: BUS normal, vagina is pink and rugated, cervix is parous without lesion, uterus is small and anteverted, no adnexal mass or tenderness. There is leakage of fluid with coughing and some descent of the bladder with valsalva. Some weakness of the posterior vaginal wall as well.   Neurological: She is alert and oriented to person, place, and time.  Skin: Skin is warm and dry.  Psychiatric: She has a normal mood and affect.       Assessment & Plan:   Problem List Items Addressed This Visit      Unprioritized   Urinary incontinence    Some GSUI. No real urge. Desires treatment. Will refer to Uro/GYN.      Relevant Orders   Ambulatory referral to  Urogynecology    Other Visit Diagnoses    Dysuria    -  Primary   Relevant Orders   POCT urinalysis dipstick (Completed)      Total face-to-face time with patient: 15 minutes. Over 50% of encounter was spent on counseling and coordination of care. Return in about 3 months (around 04/12/2018).  Lisa Rich 01/10/2018 9:20 AM

## 2018-01-10 NOTE — Patient Instructions (Signed)
Urinary Incontinence Urinary incontinence is the involuntary loss of urine from your bladder. What are the causes? There are many causes of urinary incontinence. They include:  Medicines.  Infections.  Prostatic enlargement, leading to overflow of urine from your bladder.  Surgery.  Neurological diseases.  Emotional factors.  What are the signs or symptoms? Urinary Incontinence can be divided into four types: 1. Urge incontinence. Urge incontinence is the involuntary loss of urine before you have the opportunity to go to the bathroom. There is a sudden urge to void but not enough time to reach a bathroom. 2. Stress incontinence. Stress incontinence is the sudden loss of urine with any activity that forces urine to pass. It is commonly caused by anatomical changes to the pelvis and sphincter areas of your body. 3. Overflow incontinence. Overflow incontinence is the loss of urine from an obstructed opening to your bladder. This results in a backup of urine and a resultant buildup of pressure within the bladder. When the pressure within the bladder exceeds the closing pressure of the sphincter, the urine overflows, which causes incontinence, similar to water overflowing a dam. 4. Total incontinence. Total incontinence is the loss of urine as a result of the inability to store urine within your bladder.  How is this diagnosed? Evaluating the cause of incontinence may require:  A thorough and complete medical and obstetric history.  A complete physical exam.  Laboratory tests such as a urine culture and sensitivities.  When additional tests are indicated, they can include:  An ultrasound exam.  Kidney and bladder X-rays.  Cystoscopy. This is an exam of the bladder using a narrow scope.  Urodynamic testing to test the nerve function to the bladder and sphincter areas.  How is this treated? Treatment for urinary incontinence depends on the cause:  For urge incontinence caused  by a bacterial infection, antibiotics will be prescribed. If the urge incontinence is related to medicines you take, your health care provider may have you change the medicine.  For stress incontinence, surgery to re-establish anatomical support to the bladder or sphincter, or both, will often correct the condition.  For overflow incontinence caused by an enlarged prostate, an operation to open the channel through the enlarged prostate will allow the flow of urine out of the bladder. In women with fibroids, a hysterectomy may be recommended.  For total incontinence, surgery on your urinary sphincter may help. An artificial urinary sphincter (an inflatable cuff placed around the urethra) may be required. In women who have developed a hole-like passage between their bladder and vagina (vesicovaginal fistula), surgery to close the fistula often is required.  Follow these instructions at home:  Normal daily hygiene and the use of pads or adult diapers that are changed regularly will help prevent odors and skin damage.  Avoid caffeine. It can overstimulate your bladder.  Use the bathroom regularly. Try about every 2-3 hours to go to the bathroom, even if you do not feel the need to do so. Take time to empty your bladder completely. After urinating, wait a minute. Then try to urinate again.  For causes involving nerve dysfunction, keep a log of the medicines you take and a journal of the times you go to the bathroom. Contact a health care provider if:  You experience worsening of pain instead of improvement in pain after your procedure.  Your incontinence becomes worse instead of better. Get help right away if:  You experience fever or shaking chills.  You are unable to   pass your urine.  You have redness spreading into your groin or down into your thighs. This information is not intended to replace advice given to you by your health care provider. Make sure you discuss any questions you have  with your health care provider. Document Released: 03/18/2004 Document Revised: 09/19/2015 Document Reviewed: 07/18/2012 Elsevier Interactive Patient Education  2018 Elsevier Inc.  

## 2018-01-10 NOTE — Assessment & Plan Note (Signed)
Some GSUI. No real urge. Desires treatment. Will refer to Uro/GYN.

## 2018-02-06 ENCOUNTER — Encounter: Payer: Self-pay | Admitting: Radiology

## 2018-06-05 DIAGNOSIS — E669 Obesity, unspecified: Secondary | ICD-10-CM | POA: Diagnosis not present

## 2019-03-01 ENCOUNTER — Ambulatory Visit (INDEPENDENT_AMBULATORY_CARE_PROVIDER_SITE_OTHER): Payer: 59 | Admitting: Obstetrics and Gynecology

## 2019-03-01 ENCOUNTER — Other Ambulatory Visit: Payer: Self-pay

## 2019-03-01 ENCOUNTER — Encounter: Payer: Self-pay | Admitting: Obstetrics and Gynecology

## 2019-03-01 VITALS — BP 117/78 | HR 72 | Wt 258.0 lb

## 2019-03-01 DIAGNOSIS — Z975 Presence of (intrauterine) contraceptive device: Secondary | ICD-10-CM

## 2019-03-01 DIAGNOSIS — Z1151 Encounter for screening for human papillomavirus (HPV): Secondary | ICD-10-CM

## 2019-03-01 DIAGNOSIS — R1031 Right lower quadrant pain: Secondary | ICD-10-CM

## 2019-03-01 DIAGNOSIS — Z113 Encounter for screening for infections with a predominantly sexual mode of transmission: Secondary | ICD-10-CM | POA: Diagnosis not present

## 2019-03-01 DIAGNOSIS — N393 Stress incontinence (female) (male): Secondary | ICD-10-CM | POA: Diagnosis not present

## 2019-03-01 DIAGNOSIS — G8929 Other chronic pain: Secondary | ICD-10-CM | POA: Insufficient documentation

## 2019-03-01 DIAGNOSIS — Z124 Encounter for screening for malignant neoplasm of cervix: Secondary | ICD-10-CM | POA: Diagnosis not present

## 2019-03-01 DIAGNOSIS — Z01419 Encounter for gynecological examination (general) (routine) without abnormal findings: Secondary | ICD-10-CM

## 2019-03-01 NOTE — Progress Notes (Addendum)
Obstetrics and Gynecology Annual Patient Evaluation  Appointment Date: 03/01/2019  OBGYN Clinic: [facility]  Primary Care Provider: [facility]  Chief Complaint:  Chief Complaint  Patient presents with  . Gynecologic Exam    History of Present Illness: Lisa Rich is a 34 y.o. Caucasian G2P2002 (No LMP recorded (lmp unknown). (Menstrual status: IUD).), seen for the above chief complaint. Her past medical history is significant for BMI 30s, stress UI, IUD in place  *RLQ pain: for past few months, right sided, lightening-like, non radiating, better with holding her side, no aggravating s/s, no prior s/s; no dysuria, hematuria, diarrhea, constipation, bloodin BMs, dyspareunia, nausea, vaginal bleeding or discharge.   *SUI: still occurring. No worse than when seen last year at her annual. No OAB s/s. She does have to get up during the night to void.    No breast s/s  Review of Systems: Pertinent items noted in HPI and remainder of comprehensive ROS otherwise negative.    Past Medical History:  Past Medical History:  Diagnosis Date  . Abnormal Pap smear 2007  . Anxiety and depression   . Hypertension   . Pregnancy induced hypertension 2008   preclampsia    Past Surgical History:  Past Surgical History:  Procedure Laterality Date  . WISDOM TOOTH EXTRACTION  2011    x 4    Past Obstetrical History:  OB History  Gravida Para Term Preterm AB Living  2 2 2  0 0 2  SAB TAB Ectopic Multiple Live Births  0 0 0 0 2    # Outcome Date GA Lbr Len/2nd Weight Sex Delivery Anes PTL Lv  2 Term 06/03/11 [redacted]w[redacted]d 17:33 / 00:32 9 lb 6.3 oz (4.26 kg) M Vag-Spont EPI  LIV  1 Term 05/11/06 [redacted]w[redacted]d  7 lb 10 oz (3.459 kg) M Vag-Spont EPI  LIV    Past Gynecological History: As per HPI. Periods: rare, just spotting and cramping.   History of Pap Smear(s): Yes.   Last pap 2018, which was negative She is currently using Liletta IUD (placed 02/2016) for  contraception.   Social History:  Social History   Socioeconomic History  . Marital status: Married    Spouse name: Not on file  . Number of children: Not on file  . Years of education: Not on file  . Highest education level: Not on file  Occupational History  . Not on file  Tobacco Use  . Smoking status: Former Smoker    Packs/day: 0.00    Quit date: 01/2015    Years since quitting: 4.1  . Smokeless tobacco: Never Used  Substance and Sexual Activity  . Alcohol use: No  . Drug use: No  . Sexual activity: Yes    Partners: Male    Birth control/protection: I.U.D.  Other Topics Concern  . Not on file  Social History Narrative  . Not on file   Social Determinants of Health   Financial Resource Strain:   . Difficulty of Paying Living Expenses: Not on file  Food Insecurity:   . Worried About 02/2015 in the Last Year: Not on file  . Ran Out of Food in the Last Year: Not on file  Transportation Needs:   . Lack of Transportation (Medical): Not on file  . Lack of Transportation (Non-Medical): Not on file  Physical Activity:   . Days of Exercise per Week: Not on file  . Minutes of Exercise per Session: Not on file  Stress:   . Feeling of Stress : Not on file  Social Connections:   . Frequency of Communication with Friends and Family: Not on file  . Frequency of Social Gatherings with Friends and Family: Not on file  . Attends Religious Services: Not on file  . Active Member of Clubs or Organizations: Not on file  . Attends Banker Meetings: Not on file  . Marital Status: Not on file  Intimate Partner Violence:   . Fear of Current or Ex-Partner: Not on file  . Emotionally Abused: Not on file  . Physically Abused: Not on file  . Sexually Abused: Not on file    Family History:  Family History  Problem Relation Age of Onset  . Hypertension Mother   . Depression Mother   . Hypertension Father   . Bipolar disorder Father   . Diabetes Maternal  Grandmother   . Asthma Maternal Grandmother   . Heart disease Maternal Grandmother        CHF  . Heart disease Maternal Grandfather        CHF   Medications Nakeeta D. Hubbard had no medications administered during this visit. Current Outpatient Medications  Medication Sig Dispense Refill  . ALPRAZolam (XANAX) 0.5 MG tablet TAKE 1 TABLET BY MOUTH NIGHTLY AS NEEDED FOR ANXIETY    . citalopram (CELEXA) 20 MG tablet Take by mouth.     No current facility-administered medications for this visit.    Allergies Patient has no known allergies.   Physical Exam:  BP 117/78   Pulse 72   Wt 258 lb (117 kg)   LMP  (LMP Unknown)   BMI 38.10 kg/m  Body mass index is 38.1 kg/m. Weight last year: 259 lbs General appearance: Well nourished, well developed female in no acute distress.  Neck:  Supple, normal appearance, and no thyromegaly  Cardiovascular: normal s1 and s2.  No murmurs, rubs or gallops. Respiratory:  Clear to auscultation bilateral. Normal respiratory effort Abdomen: positive bowel sounds and no masses, hernias; diffusely non tender to palpation, non distended Breasts: breasts appear normal, no suspicious masses, no skin or nipple changes or axillary nodes, and normal palpation . Neuro/Psych:  Normal mood and affect.  Skin:  Warm and dry.  Lymphatic:  No inguinal lymphadenopathy.   Pelvic exam: is not limited by body habitus EGBUS: within normal limits, Vagina: within normal limits and with no blood or discharge in the vault, Cervix: normal appearing cervix without tenderness, discharge or lesions. IUD strings 3-4cm in length tucked into fornices. Uterus:  nonenlarged and non tender and Adnexa:  normal adnexa and no mass, fullness, tenderness Rectovaginal: deferred  Laboratory: none  Radiology: none  Assessment: pt stable  Plan:  1. Stress incontinence of urine D/w her that I recommend keeping her bladder empty with scheduled voiding q60-81m and no more fluids after  about 1900. I also told her that can do pelvic floor PT and if she is done with childbearing can do a urogyn referral to talk to her about this and see if surgical methods are an option, such as a sling. I also told her that if she's done with childbearing they could also talk to her about a BTL if a sling is the direction they are going.    She'd like to try PT and keeping her bladder empty first. Will get a UCx today - Ambulatory referral to Physical Therapy  2. Chronic RLQ pain Transvaginal u/s. Pt may be done with childbearing and  if so then d/w her re: IUD removal and vasectomy or BTL.  - Cytology - PAP( [facility]) - US PELVIC COMPLETE WITH TRANSVAGINAL; Future - Urine Culture-GYN  3. IUD (intrauterine device) in place  4. Women's annual routine gynecological examination Routine care - Cytology - PAP( [place]) - US PELVIC COMPLETE WITH TRANSVAGINAL; Future - Urine Culture-GYN  Orders Placed This Encounter  Procedures  . Urine Culture-GYN  . US PELVIC COMPLETE WITH TRANSVAGINAL  . Ambulatory referral to Physical Therapy    RTC PRN. Follow up after u/s  Durene Romans MD Attending [facility])

## 2019-03-01 NOTE — Progress Notes (Signed)
Last pap 03/23/2016 Right side pain x 2-3 months Discuss other birth control options

## 2019-03-02 LAB — CYTOLOGY - PAP
Chlamydia: NEGATIVE
Comment: NEGATIVE
Comment: NEGATIVE
Comment: NEGATIVE
Comment: NORMAL
Diagnosis: NEGATIVE
High risk HPV: NEGATIVE
Neisseria Gonorrhea: NEGATIVE
Trichomonas: NEGATIVE

## 2019-03-02 LAB — URINE CULTURE: Organism ID, Bacteria: NO GROWTH

## 2019-03-08 ENCOUNTER — Other Ambulatory Visit: Payer: Self-pay

## 2019-03-08 ENCOUNTER — Ambulatory Visit (HOSPITAL_COMMUNITY)
Admission: RE | Admit: 2019-03-08 | Discharge: 2019-03-08 | Disposition: A | Discharge status: Home / Self Care | Payer: 59 | Source: Ambulatory Visit | Attending: Obstetrics and Gynecology | Admitting: Obstetrics and Gynecology

## 2019-03-08 DIAGNOSIS — Z01419 Encounter for gynecological examination (general) (routine) without abnormal findings: Secondary | ICD-10-CM | POA: Diagnosis present

## 2019-03-08 DIAGNOSIS — G8929 Other chronic pain: Secondary | ICD-10-CM | POA: Diagnosis present

## 2019-03-08 DIAGNOSIS — R1031 Right lower quadrant pain: Secondary | ICD-10-CM | POA: Diagnosis present

## 2019-03-21 ENCOUNTER — Other Ambulatory Visit: Payer: Self-pay

## 2019-03-21 ENCOUNTER — Ambulatory Visit (HOSPITAL_COMMUNITY): Payer: 59 | Attending: Obstetrics and Gynecology | Admitting: Physical Therapy

## 2019-03-21 DIAGNOSIS — N3946 Mixed incontinence: Secondary | ICD-10-CM | POA: Diagnosis present

## 2019-03-21 NOTE — Therapy (Signed)
[place], [place], [address] Phone: [phone]   Fax:  [phone]  Physical Therapy Evaluation  Patient Details  Name: Lisa Rich MRN: [id number] Date of Birth: 1985/03/21 Referring Provider (PT): Aletha Halim    Encounter Date: 03/21/2019  PT End of Session - 03/21/19 1334    Visit Number  1    Number of Visits  4    Date for PT Re-Evaluation  04/18/19    Authorization Type  23 visit  limitation    Authorization - Visit Number  1    Authorization - Number of Visits  4    PT Start Time  0830    PT Stop Time  0915    PT Time Calculation (min)  45 min    Activity Tolerance  Patient tolerated treatment well    Behavior During Therapy  [facility] for tasks assessed/performed       Past Medical History:  Diagnosis Date  . Abnormal Pap smear 2007  . Anxiety and depression   . Hypertension   . Pregnancy induced hypertension 2008   preclampsia    Past Surgical History:  Procedure Laterality Date  . WISDOM TOOTH EXTRACTION  2011    x 4    There were no vitals filed for this visit.   Subjective Assessment - 03/21/19 0839    Subjective  PT states that she is having difficulty with incontinence.  She also notes dribbling after voiding.   She states that she has had incontinence since her first child was born in 2008.  She had a second child was born in 2013 which exacerbated the leakage but it has become very bothersome in the past year.  She is having leakage 5-6 x a day.  She is using 3-4 x a day.  She is unable to get to the bathroom in time, she will urinate when she coughs, sneezes, pick up items, running or jumping.    Pertinent History  hx of two children         [facility] PT Assessment - 03/21/19 0001      Assessment   Medical Diagnosis  Urinary incontinence    Referring Provider (PT)  Charlie Pickens     Onset Date/Surgical Date  --   about a year   Next MD Visit  not scheduled    Prior  Therapy  none      Precautions   Precautions  None      Restrictions   Weight Bearing Restrictions  No      Balance Screen   Has the patient fallen in the past 6 months  No    Has the patient had a decrease in activity level because of a fear of falling?   No    Is the patient reluctant to leave their home because of a fear of falling?   No      Prior Function   Level of Independence  Independent      Cognition   Overall Cognitive Status  Within Functional Limits for tasks assessed      Posture/Postural Control   Posture Comments  --   protruding abdominal      ROM / Strength   AROM / PROM / Strength  Strength      Strength   Strength Assessment Site  Other (comment)   ABDOMINAL;  LEGS 90 able to lower to 40 with noted tremors.  Objective measurements completed on examination: See above findings.    Pelvic Floor Special Questions - 03/21/19 0001    Prior Pelvic/Prostate Exam  Yes    Result Pelvic/Prostate Exam   normal     Prior Urinalysis  No    Are you Pregnant or attempting pregnancy?  Yes    Prior Pregnancies  Yes    Number of Pregnancies  2    Number of Vaginal Deliveries  2    Any difficulty with labor and deliveries  No    Episiotomy Performed  No    Currently Sexually Active  Yes    Urinary Leakage  Yes    How often  6    Pad use  4    Activities that cause leaking  With strong urge;Coughing;Sneezing;Laughing;Lifting;Bending;Walking;Running;Exercising;Intercourse    Urinary urgency  Yes    Urinary frequency  every hour     Fecal incontinence  No    Fluid intake  a gallon     Caffeine beverages  no    Falling out feeling (prolapse)  No   no      [facility] Adult PT Treatment/Exercise - 03/21/19 0001      Exercises   Exercises  Lumbar      Lumbar Exercises: Seated   Other Seated Lumbar Exercises  5 second kegal rest 10 x10. 2" hold 4" rest x 1 minute       Lumbar Exercises: Supine   Ab Set  5 seconds    AB Set Limitations   12-6; 2-8, 3-9, 5-11 then all together     Pelvic Tilt Limitations  marble glide x 3     Glut Set Limitations  pelvic to tailbone, sits bones together then all 4.               PT Education - 03/21/19 1332    Education Details  not to expect immediate results.  HEP    Person(s) Educated  Patient    Methods  Explanation;Handout    Comprehension  Verbalized understanding;Returned demonstration       PT Short Term Goals - 03/21/19 1344      PT SHORT TERM GOAL #1   Title  Pt to only be having 3 incontinence episodes a day    Time  2    Period  Weeks    Status  New    Target Date  04/04/19      PT SHORT TERM GOAL #2   Title  PT to only be using 2 pads a day.    Time  2    Period  Weeks    Status  New      PT SHORT TERM GOAL #3   Title  PT to be I in HEP for strengthening of abdominal and pelvic floor to allow the above to occur.    Time  2    Period  Weeks    Status  New        PT Long Term Goals - 03/21/19 1345      PT LONG TERM GOAL #1   Title  PT to only be having one episode of incontinence a day    Time  4    Period  Weeks    Status  New    Target Date  04/18/19      PT LONG TERM GOAL #2   Title  PT to be I in advanced HEP in order to feel confident in continuing HEP to achieve  full continence.    Time  4    Period  Weeks             Plan - 03/21/19 1338    Clinical Impression Statement  Lisa Rich is a 34 yo female who has been referred to skilled PT for incontinence.  She is having approximately 6 leakages a day and going thru 4 pads a day.  Evaluation demonstrates decreased strength and endurance of pelvic floor mm, decreased abdominal strength.  Lisa Rich will benefit from skilled PT for therapuetic exercises to improve her abdominal and pelvic floor strength to decrease her incontinence.    Personal Factors and Comorbidities  Time since onset of injury/illness/exacerbation;Fitness    Examination-Activity Limitations  Other     Stability/Clinical Decision Making  Stable/Uncomplicated    Clinical Decision Making  Low    Rehab Potential  Good    PT Frequency  1x / week    PT Duration  4 weeks    PT Treatment/Interventions  Patient/family education;Therapeutic exercise    PT Next Visit Plan  Begin fig flossing, supine heelslide with leg off floor, sidelying abduction.    PT Home Exercise Plan  kegals long and quick, abdominal clock, marble rolling, pelvic floor strengthener.       Patient will benefit from skilled therapeutic intervention in order to improve the following deficits and impairments:  Decreased strength  Visit Diagnosis: Mixed incontinence - Plan: PT plan of care cert/re-cert     Problem List Patient Active Problem List   Diagnosis Date Noted  . Stress incontinence of urine 03/01/2019  . Chronic RLQ pain 03/01/2019  . IUD (intrauterine device) in place 03/01/2019  . Urinary incontinence 01/10/2018  . History of pre-eclampsia 04/19/2011    Virgina Organ, PT CLT [phone] 03/21/2019, 1:57 PM  [facility] [address] [place], [place], [address] Phone: [phone]   Fax:  [phone]  Name: JOETTE SCHMOKER MRN: [id number] Date of Birth: 1986-01-24

## 2019-03-26 ENCOUNTER — Telehealth (HOSPITAL_COMMUNITY): Payer: Self-pay | Admitting: Physical Therapy

## 2019-03-26 NOTE — Telephone Encounter (Signed)
She can not come in on this date and offered what we had opened -nothing worked.Lisa Rich

## 2019-03-29 ENCOUNTER — Ambulatory Visit (HOSPITAL_COMMUNITY): Payer: 59 | Admitting: Physical Therapy

## 2019-03-29 ENCOUNTER — Encounter (HOSPITAL_COMMUNITY): Payer: Self-pay | Admitting: Physical Therapy

## 2019-03-29 ENCOUNTER — Other Ambulatory Visit: Payer: Self-pay

## 2019-03-29 ENCOUNTER — Ambulatory Visit (HOSPITAL_COMMUNITY): Payer: 59 | Attending: Obstetrics and Gynecology | Admitting: Physical Therapy

## 2019-03-29 DIAGNOSIS — N3946 Mixed incontinence: Secondary | ICD-10-CM | POA: Insufficient documentation

## 2019-03-29 NOTE — Therapy (Signed)
[facility] [address] [place], [place], [address] Phone: [phone]   Fax:  [phone]  Physical Therapy Treatment  Patient Details  Name: Lisa Rich MRN: [id number] Date of Birth: March 03, 1985 Referring Provider (PT): Fernley Bing    Encounter Date: 03/29/2019  PT End of Session - 03/29/19 1432    Visit Number  2    Number of Visits  4    Date for PT Re-Evaluation  04/18/19    Authorization Type  23 visit  limitation    Authorization - Visit Number  2    Authorization - Number of Visits  4    PT Start Time  1400    PT Stop Time  1440    PT Time Calculation (min)  40 min    Activity Tolerance  Patient tolerated treatment well    Behavior During Therapy  [facility] for tasks assessed/performed       Past Medical History:  Diagnosis Date  . Abnormal Pap smear 2007  . Anxiety and depression   . Hypertension   . Pregnancy induced hypertension 2008   preclampsia    Past Surgical History:  Procedure Laterality Date  . WISDOM TOOTH EXTRACTION  2011    x 4    There were no vitals filed for this visit.  Subjective Assessment - 03/29/19 1359    Subjective  Pt states that she is doing well with her exercises. She is able to tell a difference already 12    Pertinent History  hx of two children    Currently in Pain?  No/denies               [facility] Adult PT Treatment/Exercise - 03/29/19 0001      Exercises   Exercises  Lumbar      Lumbar Exercises: Seated   Other Seated Lumbar Exercises  10second kegal rest 20 x10. 2" hold 4" rest x 1 minute     Other Seated Lumbar Exercises  marble and ab exercises       Lumbar Exercises: Supine   Heel Slides  5 reps    Heel Slides Limitations  leg off table       Lumbar Exercises: Sidelying   Hip Abduction  Both;10 reps               PT Short Term Goals - 03/29/19 1447      PT SHORT TERM GOAL #1   Title  Pt to only be having 3 incontinence episodes a day     Time  2    Period  Weeks    Status  On-going    Target Date  04/04/19      PT SHORT TERM GOAL #2   Title  PT to only be using 2 pads a day.    Time  2    Period  Weeks    Status  On-going      PT SHORT TERM GOAL #3   Title  PT to be I in HEP for strengthening of abdominal and pelvic floor to allow the above to occur.    Time  2    Period  Weeks    Status  On-going        PT Long Term Goals - 03/29/19 1447      PT LONG TERM GOAL #1   Title  PT to only be having one episode of incontinence a day    Time  4  Period  Weeks    Status  On-going      PT LONG TERM GOAL #2   Title  PT to be I in advanced HEP in order to feel confident in continuing HEP to achieve full continence.    Time  4    Period  Weeks    Status  On-going            Plan - 03/29/19 1441    Clinical Impression Statement  PT states that she can already tell an improvement in her continence.  PT sore from exercises but nothing she can not handle.  Modified abdominal and marble to sitting position for ease of completion. Increased Kegal to a 10 second hold with goal of ultimately being able to hold for 30.  Added heelslide and hip abduction with abdominal holds    Personal Factors and Comorbidities  Time since onset of injury/illness/exacerbation;Fitness    Examination-Activity Limitations  Other    Stability/Clinical Decision Making  Stable/Uncomplicated    Rehab Potential  Good    PT Frequency  1x / week    PT Duration  4 weeks    PT Treatment/Interventions  Patient/family education;Therapeutic exercise    PT Next Visit Plan  begin prone of quadriped opposite arm leg/ begin standing tubing exercises. as well as abdominal crunch.    PT Home Exercise Plan  kegals long and quick, abdominal clock, marble rolling, pelvic floor strengthener.       Patient will benefit from skilled therapeutic intervention in order to improve the following deficits and impairments:  Decreased strength  Visit  Diagnosis: Mixed incontinence     Problem List Patient Active Problem List   Diagnosis Date Noted  . Stress incontinence of urine 03/01/2019  . Chronic RLQ pain 03/01/2019  . IUD (intrauterine device) in place 03/01/2019  . Urinary incontinence 01/10/2018  . History of pre-eclampsia 04/19/2011    Rayetta Humphrey, PT CLT [phone] 03/29/2019, 2:48 PM  [place] [address] [place], [place], [address] Phone: [phone]   Fax:  [phone]  Name: Lisa Rich MRN: [id number] Date of Birth: February 10, 1986

## 2019-04-04 ENCOUNTER — Encounter (HOSPITAL_COMMUNITY): Payer: Self-pay | Admitting: Physical Therapy

## 2019-04-04 ENCOUNTER — Other Ambulatory Visit: Payer: Self-pay

## 2019-04-04 ENCOUNTER — Ambulatory Visit (HOSPITAL_COMMUNITY): Payer: 59 | Admitting: Physical Therapy

## 2019-04-04 DIAGNOSIS — N3946 Mixed incontinence: Secondary | ICD-10-CM | POA: Diagnosis not present

## 2019-04-04 NOTE — Therapy (Signed)
[place], [place], [address] Phone: [phone]   Fax:  [phone]  Physical Therapy Treatment  Patient Details  Name: Lisa Rich MRN: [id number] Date of Birth: October 13, 1985 Referring Provider (PT): Aletha Halim    Encounter Date: 04/04/2019  PT End of Session - 04/04/19 0917    Visit Number  3    Number of Visits  4    Date for PT Re-Evaluation  04/18/19    Authorization Type  23 visit  limitation    Authorization - Visit Number  2    Authorization - Number of Visits  4    PT Start Time  0830    PT Stop Time  0910    PT Time Calculation (min)  40 min    Activity Tolerance  Patient tolerated treatment well    Behavior During Therapy  [facility] for tasks assessed/performed       Past Medical History:  Diagnosis Date  . Abnormal Pap smear 2007  . Anxiety and depression   . Hypertension   . Pregnancy induced hypertension 2008   preclampsia    Past Surgical History:  Procedure Laterality Date  . WISDOM TOOTH EXTRACTION  2011    x 4    There were no vitals filed for this visit.  Subjective Assessment - 04/04/19 0835    Subjective  Pt states that she has no questions on the exercises.  Pt continuing to feel improvement in leakage.  She is only wearing 2 pads now where she was wearing 4.    Pertinent History  hx of two children    Currently in Pain?  No/denies             [facility] Adult PT Treatment/Exercise - 04/04/19 0001      Lumbar Exercises: Standing   Shoulder Extension Limitations  shld flexion and putting exercis x 10 reps each.      Lumbar Exercises: Seated   Other Seated Lumbar Exercises  15 sec keagal with 30 res x 10; 2" with 4" rest x 10      Lumbar Exercises: Supine   AB Set Limitations  ab crunch x 15      Lumbar Exercises: Quadruped   Opposite Arm/Leg Raise  --   2 rep              PT Short Term Goals - 04/04/19 0919      PT SHORT TERM GOAL #1   Title  Pt  to only be having 3 incontinence episodes a day    Time  2    Period  Weeks    Status  Achieved    Target Date  04/04/19      PT SHORT TERM GOAL #2   Title  PT to only be using 2 pads a day.    Time  2    Period  Weeks    Status  Achieved      PT SHORT TERM GOAL #3   Title  PT to be I in HEP for strengthening of abdominal and pelvic floor to allow the above to occur.    Time  2    Period  Weeks    Status  Achieved        PT Long Term Goals - 03/29/19 1447      PT LONG TERM GOAL #1   Title  PT to only be having one episode of incontinence a day  Time  4    Period  Weeks    Status  On-going      PT LONG TERM GOAL #2   Title  PT to be I in advanced HEP in order to feel confident in continuing HEP to achieve full continence.    Time  4    Period  Weeks    Status  On-going            Plan - 04/04/19 1610    Clinical Impression Statement  Pt advanced to higher level exercises.  Attempted quadriped exercises, however these were to difficult for pt at this time, modified to prone.    Personal Factors and Comorbidities  Time since onset of injury/illness/exacerbation;Fitness    Examination-Activity Limitations  Other    Stability/Clinical Decision Making  Stable/Uncomplicated    Rehab Potential  Good    PT Frequency  1x / week    PT Duration  4 weeks    PT Treatment/Interventions  Patient/family education;Therapeutic exercise    PT Next Visit Plan  Begin overhead tband exercises.    PT Home Exercise Plan  kegals long and quick, abdominal clock, marble rolling, pelvic floor strengthener.       Patient will benefit from skilled therapeutic intervention in order to improve the following deficits and impairments:  Decreased strength  Visit Diagnosis: Mixed incontinence     Problem List Patient Active Problem List   Diagnosis Date Noted  . Stress incontinence of urine 03/01/2019  . Chronic RLQ pain 03/01/2019  . IUD (intrauterine device) in place 03/01/2019   . Urinary incontinence 01/10/2018  . History of pre-eclampsia 04/19/2011    Virgina Organ, PT CLT [phone] 04/04/2019, 9:20 AM  [place] [facility] [address] [place], [place], [address] Phone: [phone]   Fax:  [phone]  Name: Lisa Rich MRN: [id number] Date of Birth: [birth year]

## 2019-04-10 ENCOUNTER — Ambulatory Visit (HOSPITAL_COMMUNITY): Payer: 59 | Admitting: Physical Therapy

## 2019-04-10 ENCOUNTER — Other Ambulatory Visit: Payer: Self-pay

## 2019-04-10 DIAGNOSIS — N3946 Mixed incontinence: Secondary | ICD-10-CM

## 2019-04-10 NOTE — Therapy (Signed)
[place], [place], [address] Phone: [phone]   Fax:  [phone]  Physical Therapy Treatment  Patient Details  Name: Lisa Rich MRN: [id number] Date of Birth: 05/10/1985 Referring Provider (PT): [place] SUMMARY  Visits from Start of Care: 4  Current functional level related to goals / functional outcomes: See below   Remaining deficits: See below   Education / Equipment: HEP Plan: Patient agrees to discharge.  Patient goals were partially met. Patient is being discharged due to being pleased with the current functional level.  ?????       Encounter Date: 04/10/2019  PT End of Session - 04/10/19 1013    Visit Number  4    Number of Visits  4    Date for PT Re-Evaluation  04/18/19    Authorization Type  23 visit  limitation    Authorization - Visit Number  4    Authorization - Number of Visits  4    PT Start Time  0920    PT Stop Time  1003    PT Time Calculation (min)  43 min    Activity Tolerance  Patient tolerated treatment well    Behavior During Therapy  [facility] for tasks assessed/performed       Past Medical History:  Diagnosis Date  . Abnormal Pap smear 2007  . Anxiety and depression   . Hypertension   . Pregnancy induced hypertension 2008   preclampsia    Past Surgical History:  Procedure Laterality Date  . WISDOM TOOTH EXTRACTION  2011    x 4    There were no vitals filed for this visit.  Subjective Assessment - 04/10/19 0920    Subjective  Pt did not have power over the weekend and therefore did not complete her exercises as regularly.    Pertinent History  hx of two children    Currently in Pain?  No/denies           [facility] Adult PT Treatment/Exercise - 04/10/19 0001      Lumbar Exercises: Standing   Shoulder Extension Limitations  shld flexion, putting , overhead pull down single and double as well as paloff x 10 each  with green tband.       Lumbar Exercises: Seated   Other Seated Lumbar Exercises  15 sec keagal with 30 res x 10; 2" with 4" rest x 10      Lumbar Exercises: Supine   AB Set Limitations  ab crunch x 15      Lumbar Exercises: Quadruped   Opposite Arm/Leg Raise  Right arm/Left leg;Left arm/Right leg;5 reps               PT Short Term Goals - 04/10/19 0959      PT SHORT TERM GOAL #1   Title  Pt to only be having 3 incontinence episodes a day    Time  2    Period  Weeks    Status  Achieved    Target Date  04/04/19      PT SHORT TERM GOAL #2   Title  PT to only be using 2 pads a day.    Time  2    Period  Weeks    Status  Achieved      PT SHORT TERM GOAL #3   Title  PT to be I in HEP for strengthening of abdominal and pelvic floor to  allow the above to occur.    Time  2    Period  Weeks    Status  Achieved        PT Long Term Goals - 04/10/19 0959      PT LONG TERM GOAL #1   Title  PT to only be having one episode of incontinence a day    Time  4    Period  Weeks    Status  On-going      PT LONG TERM GOAL #2   Title  PT to be I in advanced HEP in order to feel confident in continuing HEP to achieve full continence.    Time  4    Period  Weeks    Status  Achieved            Plan - 04/10/19 1013    Clinical Impression Statement  Therapist instructed pt on overthe door t-band exercises as well as Pallof  for improved abdominal strength.  Pt unable to increase time with kegal this session .  Therapist and pt discusses that it will most likely take pt 2-5 more months to become continent.  She is I with her HEP and understands what she needs to do to meet her goals.  Both pt and therapist are in agreement to discharge this session.    Personal Factors and Comorbidities  Time since onset of injury/illness/exacerbation;Fitness    Examination-Activity Limitations  Other    Stability/Clinical Decision Making  Stable/Uncomplicated    Rehab Potential  Good    PT  Frequency  1x / week    PT Duration  4 weeks    PT Treatment/Interventions  Patient/family education;Therapeutic exercise    PT Next Visit Plan  Discharge    PT Home Exercise Plan  kegals long and quick, abdominal clock, marble rolling, pelvic floor strengthener.: Ab crunch, heelslide, hip abduction; Quadriped opposite arm/leg, Tband door exercises to improve pelvic floor strength and Palloff.       Patient will benefit from skilled therapeutic intervention in order to improve the following deficits and impairments:  Decreased strength  Visit Diagnosis: Mixed incontinence     Problem List Patient Active Problem List   Diagnosis Date Noted  . Stress incontinence of urine 03/01/2019  . Chronic RLQ pain 03/01/2019  . IUD (intrauterine device) in place 03/01/2019  . Urinary incontinence 01/10/2018  . History of pre-eclampsia 04/19/2011    Rayetta Humphrey, PT CLT [phone] 04/10/2019, 10:18 AM  [place] [address] [place], [place], [address] Phone: [phone]   Fax:  [phone]  Name: WANELL LORENZI MRN: [id number] Date of Birth: 1985/03/14

## 2019-04-17 ENCOUNTER — Ambulatory Visit (HOSPITAL_COMMUNITY): Payer: 59 | Admitting: Physical Therapy

## 2019-11-19 ENCOUNTER — Encounter (HOSPITAL_COMMUNITY): Payer: Self-pay | Admitting: Physical Therapy

## 2020-01-02 ENCOUNTER — Encounter: Payer: Self-pay | Admitting: Radiology

## 2020-05-29 ENCOUNTER — Ambulatory Visit: Payer: 59 | Admitting: Dermatology

## 2020-06-25 ENCOUNTER — Other Ambulatory Visit: Payer: Self-pay

## 2020-06-25 ENCOUNTER — Ambulatory Visit: Payer: BC Managed Care – PPO | Admitting: Dermatology

## 2020-06-25 DIAGNOSIS — L83 Acanthosis nigricans: Secondary | ICD-10-CM

## 2020-06-25 DIAGNOSIS — L920 Granuloma annulare: Secondary | ICD-10-CM | POA: Diagnosis not present

## 2020-06-25 MED ORDER — KETOCONAZOLE 2 % EX SHAM: MEDICATED_SHAMPOO | CUTANEOUS | 1 refills | Status: DC

## 2020-06-25 NOTE — Progress Notes (Signed)
   New Patient Visit  Subjective  Lisa Rich is a 35 y.o. female who presents for the following: Skin Problem (Pt states that she has an area on her left foot ).  She also has a rash on her trunk she would like checked.  Objective  Well appearing patient in no apparent distress; mood and affect are within normal limits.  A focused examination was performed including feet, chest, and back. Relevant physical exam findings are noted in the Assessment and Plan.  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 Will defer treatment for now.  Benign-appearing nature discussed.  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   Start ketoconazole shampoo. Apply everywhere waist up 3x per week for 6 weeks or until f/u.  May add minocycline at f/u.    Return in about 2 months (around 08/25/2020) for recheck foot, chest, and back.   IEpifania Gore, CMA, am acting as scribe for Armida Sans, MD.  Documentation: I have reviewed the above documentation for accuracy and completeness, and I agree with the above.  Armida Sans, MD

## 2020-06-27 ENCOUNTER — Encounter: Payer: Self-pay | Admitting: Dermatology

## 2020-07-16 IMAGING — US US PELVIS COMPLETE WITH TRANSVAGINAL
2 series · 15 of 25 positions shown · non-contrast
Comparison: None

CLINICAL DATA: Chronic RIGHT lower quadrant pain for 2-3 months,
IUD

EXAM:
TRANSABDOMINAL AND TRANSVAGINAL ULTRASOUND OF PELVIS
TECHNIQUE: Both transabdominal and transvaginal ultrasound examinations of the
pelvis were performed. Transabdominal technique was performed for
global imaging of the pelvis including uterus, ovaries, adnexal
regions, and pelvic cul-de-sac. It was necessary to proceed with
endovaginal exam following the transabdominal exam to visualize the
lower uterine segment and RIGHT ovary as well as IUD.

[Series 1: us pelvis complete with transvaginal · 14 of 51 slices shown (1 of 2)]
[im 1/51]
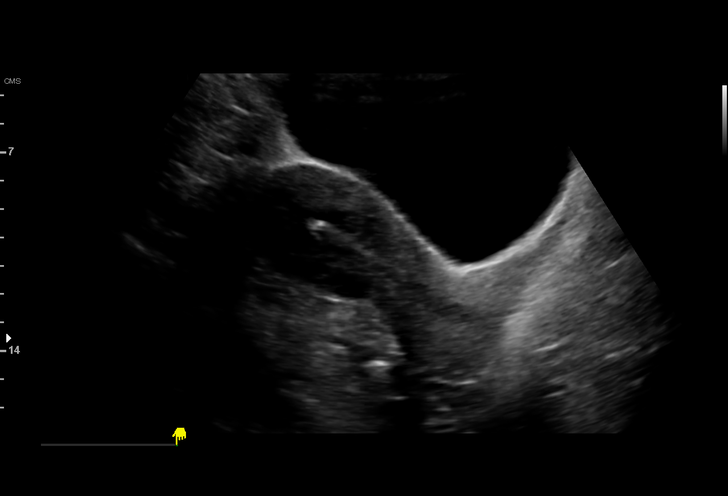
[im 5/51]
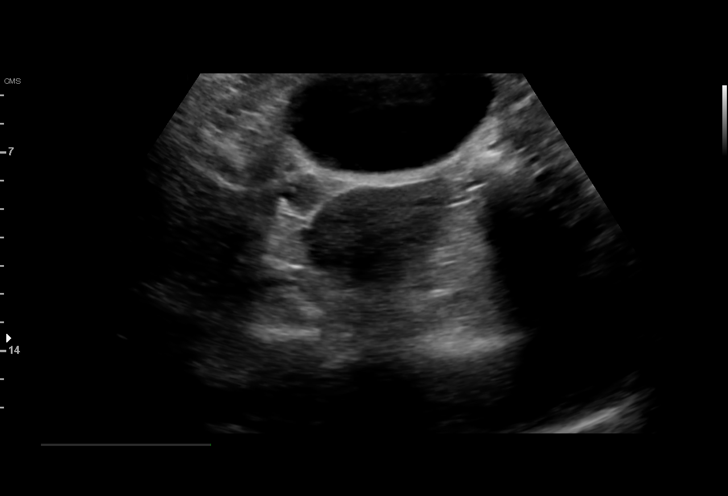
[im 9/51]
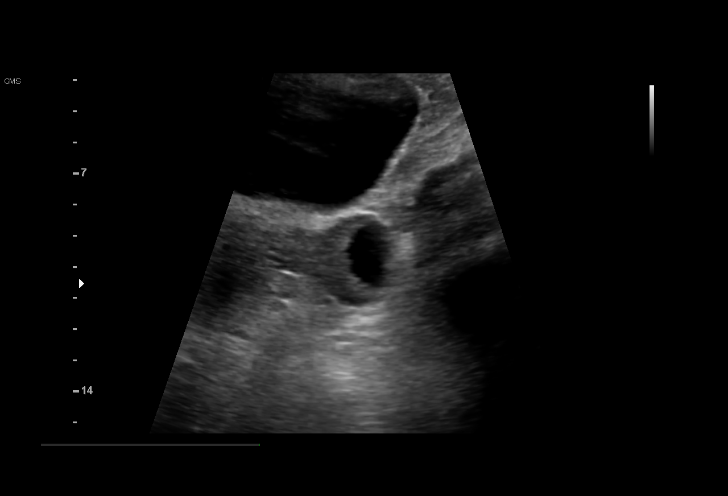
[im 11/51]
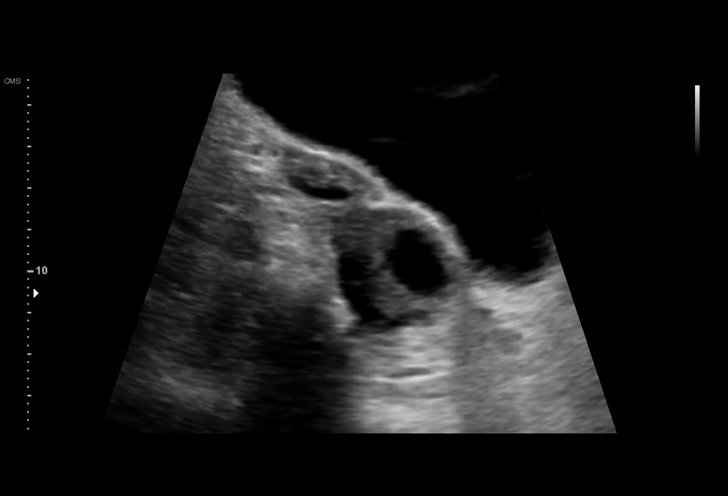
[im 16/51]
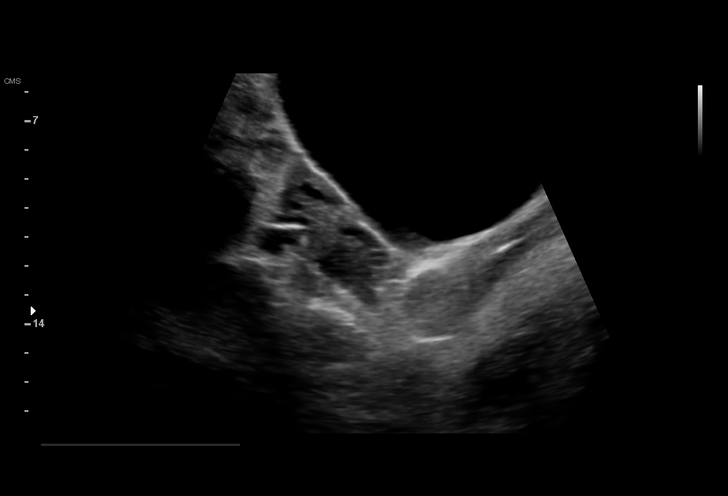
[im 20/51]
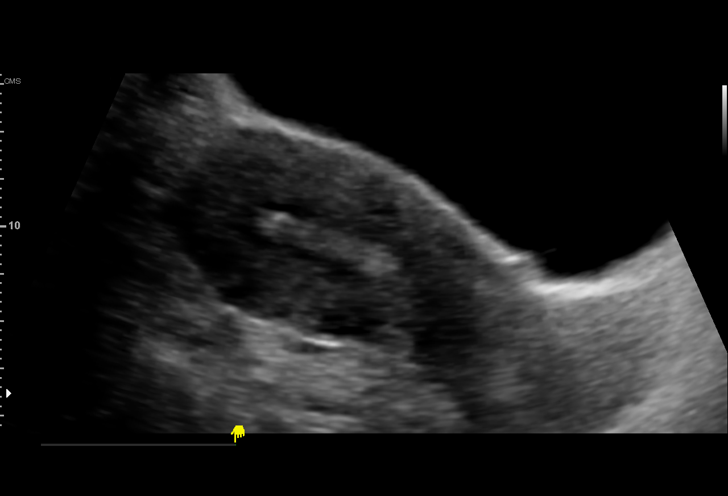
[im 22/51]
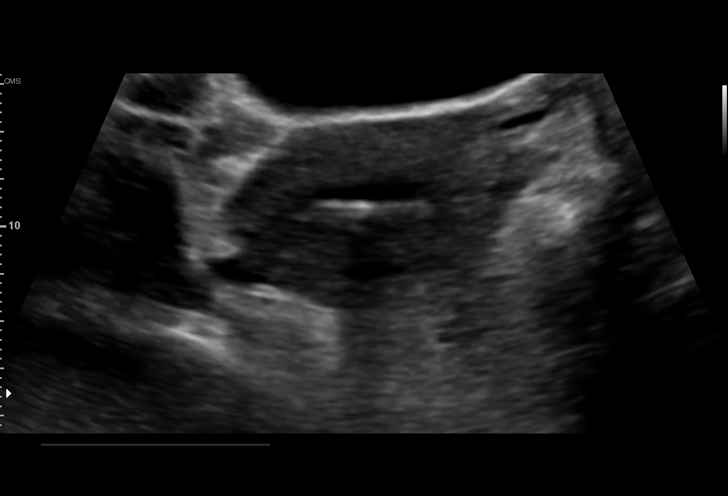
[im 27/51]
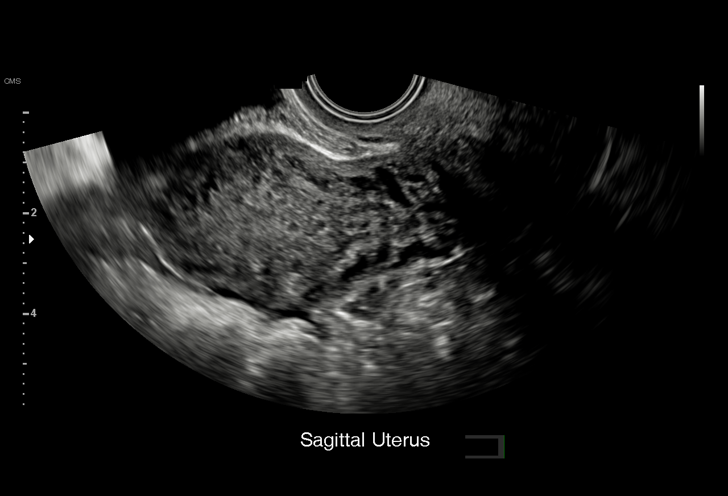
[im 31/51]
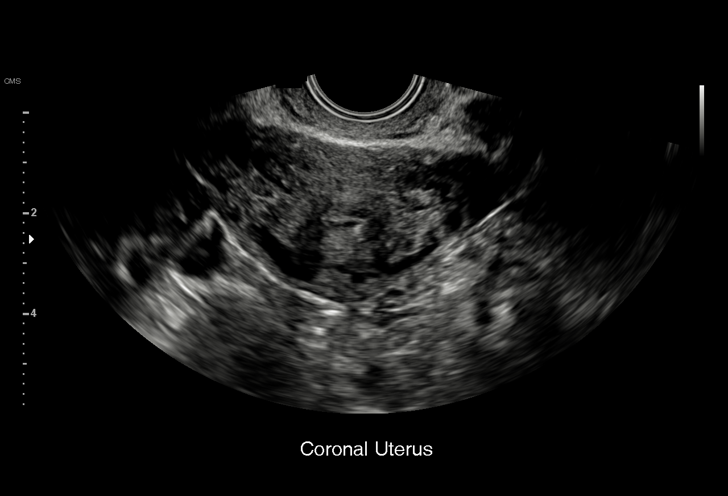
[im 33/51]
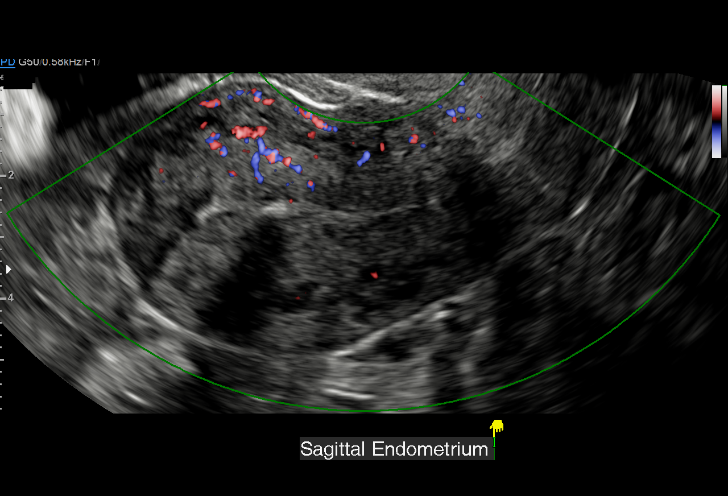
[im 37/51]
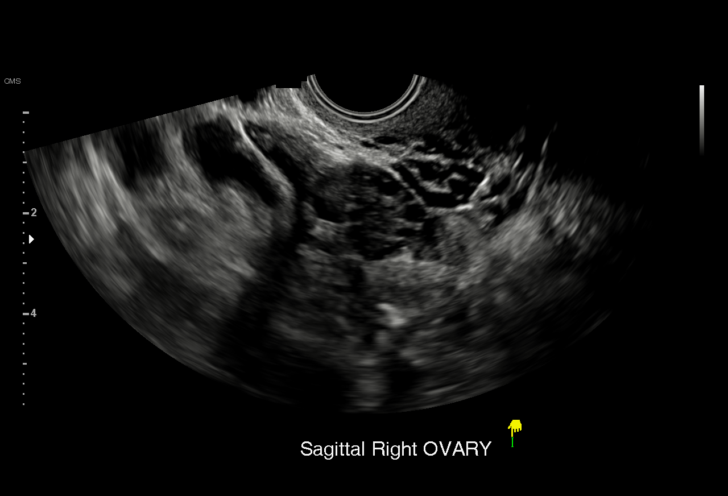
[im 42/51]
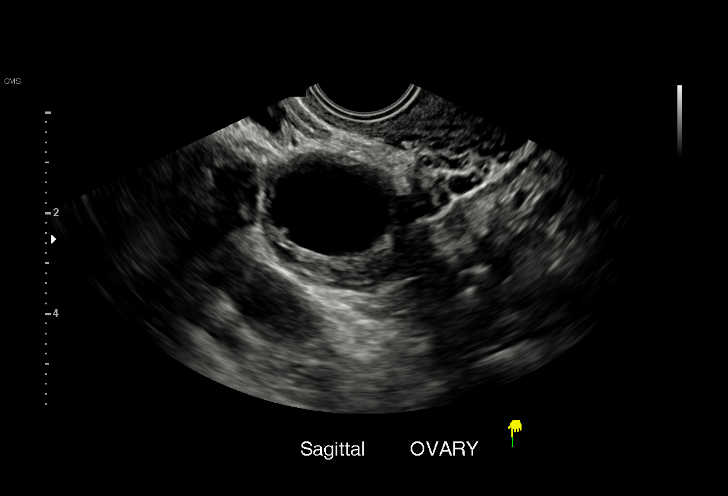
[im 44/51]
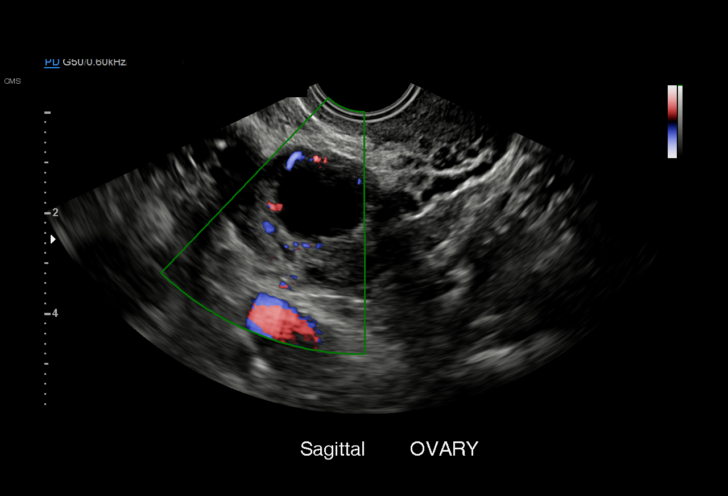
[im 48/51]
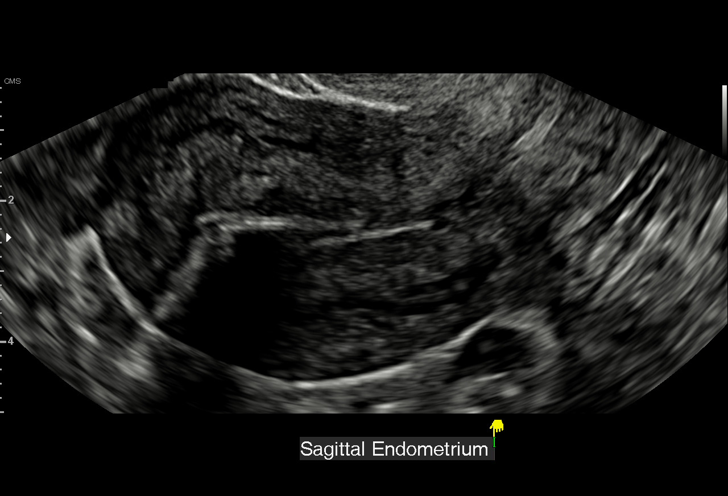

[Series 3: us pelvis complete with transvaginal · 1 of 1 slices shown (2 of 2)]
[im 1/1]
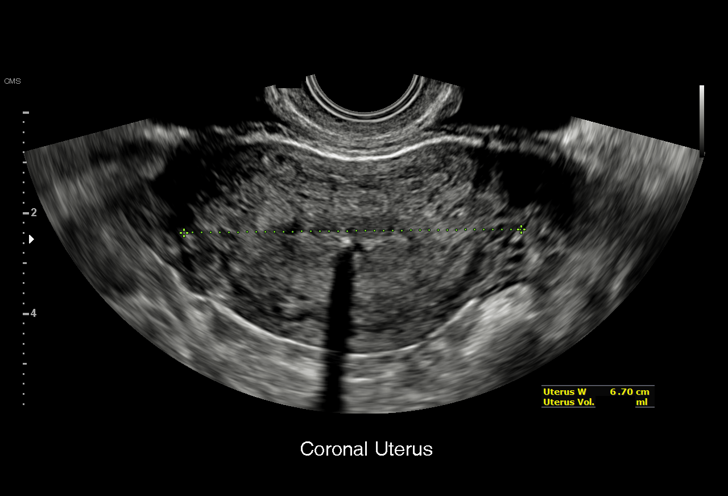

[15 of 25 positions shown; findings below may reference images not displayed]

FINDINGS: Uterus

Measurements: 6.4 x 4.6 x 6.7 cm = volume: 104 mL. Anteverted.
Normal morphology without mass

Endometrium

Thickness: 3 mm. No endometrial fluid or focal abnormality. IUD
within expected position at the upper uterine segment

Right ovary

Measurements: 3.5 x 2.3 x 2.0 cm = volume: 8.2 mL. Normal morphology
without mass

Left ovary

Measurements: 3.4 x 3.2 x 2.8 cm = volume: 16.1 mL. Dominant
follicle without mass

Other findings

No free pelvic fluid.  No other adnexal masses.
IMPRESSION: IUD within expected position at the upper uterine segment
endometrial canal.

Otherwise normal exam.

## 2020-08-21 ENCOUNTER — Encounter: Payer: Self-pay | Admitting: Obstetrics and Gynecology

## 2020-08-21 ENCOUNTER — Other Ambulatory Visit: Payer: Self-pay

## 2020-08-21 ENCOUNTER — Ambulatory Visit (INDEPENDENT_AMBULATORY_CARE_PROVIDER_SITE_OTHER): Payer: BC Managed Care – PPO | Admitting: Obstetrics and Gynecology

## 2020-08-21 VITALS — BP 134/85 | HR 65 | Ht 69.0 in | Wt 271.6 lb

## 2020-08-21 DIAGNOSIS — Z30432 Encounter for removal of intrauterine contraceptive device: Secondary | ICD-10-CM

## 2020-08-21 DIAGNOSIS — Z975 Presence of (intrauterine) contraceptive device: Secondary | ICD-10-CM

## 2020-08-21 DIAGNOSIS — N393 Stress incontinence (female) (male): Secondary | ICD-10-CM

## 2020-08-21 NOTE — Procedures (Signed)
Intrauterine Device (IUD) Removal Procedure Note Patient desires to have IUD removed; no specific reason given. Last intercourse >2 weeks ago   Prior to the procedure being performed, the patient was asked to state their full name, date of birth, and the type of procedure being performed. EGBUS normal. Vaginal vault normal. Cervix normal with IUD strings seen (approx 3-4cm in length). Strings grasped with ringed forceps and easily removed and noted to be intact.   No complications, patient tolerated the procedure well. Patient told to let us know if she has any issues with having a regularly, monthly period.   Patient states she's still having SUI issues s/p pelvic floor PT. Will refer to urogyn.   Cornelia Copa MD Attending [facility] [employer] (Faculty Practice) 08/21/2020

## 2020-08-29 ENCOUNTER — Telehealth: Payer: Self-pay

## 2020-08-29 NOTE — Telephone Encounter (Signed)
Lisa Rich is a 35 y.o. female has been contacted and scheduled for a new patient appt with Dr. Florian Buff for  urinary incontinence. Pt asked to arrive 15 min early for her appt. Appt:  11/03/20 @8 :40am

## 2020-09-11 ENCOUNTER — Ambulatory Visit: Payer: BC Managed Care – PPO | Admitting: Dermatology
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
## 2020-09-18 ENCOUNTER — Encounter: Payer: Self-pay | Admitting: Obstetrics and Gynecology

## 2020-09-18 ENCOUNTER — Other Ambulatory Visit: Payer: Self-pay

## 2020-09-18 ENCOUNTER — Ambulatory Visit (INDEPENDENT_AMBULATORY_CARE_PROVIDER_SITE_OTHER): Payer: BC Managed Care – PPO | Admitting: Obstetrics and Gynecology

## 2020-09-18 VITALS — BP 116/77 | HR 74 | Ht 69.0 in | Wt 265.0 lb

## 2020-09-18 DIAGNOSIS — N393 Stress incontinence (female) (male): Secondary | ICD-10-CM

## 2020-09-18 DIAGNOSIS — N3281 Overactive bladder: Secondary | ICD-10-CM | POA: Diagnosis not present

## 2020-09-18 DIAGNOSIS — R350 Frequency of micturition: Secondary | ICD-10-CM | POA: Diagnosis not present

## 2020-09-18 LAB — POCT URINALYSIS DIPSTICK
Appearance: NORMAL
Bilirubin, UA: NEGATIVE
Blood, UA: NEGATIVE
Glucose, UA: NEGATIVE
Ketones, UA: NEGATIVE
Leukocytes, UA: NEGATIVE
Nitrite, UA: NEGATIVE
Protein, UA: NEGATIVE
Spec Grav, UA: 1.025 (ref 1.010–1.025)
Urobilinogen, UA: 1 E.U./dL
pH, UA: 6 (ref 5.0–8.0)

## 2020-09-18 MED ORDER — MIRABEGRON ER 25 MG PO TB24: 25.0000 mg | ORAL_TABLET | Freq: Every day | ORAL | 5 refills | Status: DC

## 2020-10-08 ENCOUNTER — Telehealth: Payer: Self-pay | Admitting: *Deleted

## 2020-10-08 NOTE — Telephone Encounter (Signed)
Call to patient. Advised message received regarding combined surgery case with Dr Florian Buff. Patient desires to proceed with sterilization. Agrees to consult to discuss surgery.

## 2020-10-08 NOTE — Telephone Encounter (Signed)
Appointment is scheduled for 10-14-20.  Encounter closed.

## 2020-10-14 ENCOUNTER — Other Ambulatory Visit: Payer: Self-pay

## 2020-10-14 ENCOUNTER — Ambulatory Visit: Payer: BC Managed Care – PPO | Admitting: Obstetrics and Gynecology

## 2020-10-14 ENCOUNTER — Encounter: Payer: Self-pay | Admitting: Obstetrics and Gynecology

## 2020-10-14 VITALS — BP 117/78 | HR 62 | Ht 69.0 in | Wt 272.8 lb

## 2020-10-14 DIAGNOSIS — Z3009 Encounter for other general counseling and advice on contraception: Secondary | ICD-10-CM | POA: Diagnosis not present

## 2020-10-14 NOTE — Progress Notes (Signed)
Obstetrics and Gynecology New Patient Evaluation  Appointment Date: 10/14/2020  OBGYN Clinic: [facility]  Primary Care Provider: Jerl Mina  Referring Provider: Jerl Mina, MD  Chief Complaint: BTL consult  History of Present Illness: Lisa Rich is a 35 y.o. [phone] (Patient's last menstrual period was 10/03/2020 (exact date).), seen for the above chief complaint. Her past medical history is significant for BMI 40s, SUI.  Patient is getting set up for an MUS with Dr. Florian Buff of Urogyn and patient would like her tubes tied at the same time so patient was referred back to me for a combo case.   Review of Systems: Pertinent items noted in HPI and remainder of comprehensive ROS otherwise negative.    Patient Active Problem List   Diagnosis Date Noted   Stress incontinence of urine 03/01/2019   Chronic RLQ pain 03/01/2019   Urinary incontinence 01/10/2018    Past Medical History:  Past Medical History:  Diagnosis Date   Abnormal Pap smear 2007   Anxiety and depression    History of pre-eclampsia 04/19/2011   Hypertension    Pregnancy induced hypertension 2008   preclampsia    Past Surgical History:  Past Surgical History:  Procedure Laterality Date   WISDOM TOOTH EXTRACTION  2011    x 4    Past Obstetrical History:  OB History  Gravida Para Term Preterm AB Living  2 2 2  0 0 2  SAB IAB Ectopic Multiple Live Births  0 0 0 0 2    # Outcome Date GA Lbr Len/2nd Weight Sex Delivery Anes PTL Lv  2 Term 06/03/11 [redacted]w[redacted]d 17:33 / 00:32 9 lb 6.3 oz (4.26 kg) M Vag-Spont EPI  LIV  1 Term 05/11/06 [redacted]w[redacted]d  7 lb 10 oz (3.459 kg) M Vag-Spont EPI  LIV     Past Gynecological History: As per HPI. Pap: cytology and HPV negative 2021  Social History:  Social History   Socioeconomic History   Marital status: Married    Spouse name: Not on file   Number of children: Not on file   Years of education: Not on file   Highest education  level: Not on file  Occupational History   Not on file  Tobacco Use   Smoking status: Former    Packs/day: 0.00    Types: Cigarettes    Quit date: 01/2015    Years since quitting: 5.7   Smokeless tobacco: Never  Vaping Use   Vaping Use: Never used  Substance and Sexual Activity   Alcohol use: No   Drug use: No   Sexual activity: Yes    Partners: Male    Birth control/protection: I.U.D.  Other Topics Concern   Not on file  Social History Narrative   Not on file   Social Determinants of Health   Financial Resource Strain: Not on file  Food Insecurity: Not on file  Transportation Needs: Not on file  Physical Activity: Not on file  Stress: Not on file  Social Connections: Not on file  Intimate Partner Violence: Not on file    Family History:  Family History  Problem Relation Age of Onset   Hypertension Mother    Depression Mother    Hypertension Father    Bipolar disorder Father    Diabetes Maternal Grandmother    Asthma Maternal Grandmother    Heart disease Maternal Grandmother        CHF   Heart disease Maternal Grandfather  CHF    Medications Reality D. Hubbard had no medications administered during this visit. Current Outpatient Medications  Medication Sig Dispense Refill   ALPRAZolam (XANAX) 0.5 MG tablet TAKE 1 TABLET BY MOUTH NIGHTLY AS NEEDED FOR ANXIETY     busPIRone (BUSPAR) 10 MG tablet Take 10 mg by mouth 2 (two) times daily.     clotrimazole-betamethasone (LOTRISONE) cream Apply topically 2 (two) times daily.     lamoTRIgine (LAMICTAL) 100 MG tablet Take 100 mg by mouth daily.     mirabegron ER (MYRBETRIQ) 25 MG TB24 tablet Take 1 tablet (25 mg total) by mouth daily. 30 tablet 5   propranolol ER (INDERAL LA) 60 MG 24 hr capsule Take 60 mg by mouth daily.     VRAYLAR 1.5 MG capsule Take 1.5 mg by mouth daily.     ketoconazole (NIZORAL) 2 % shampoo Apply to body everywhere above waist 3 times per week until follow up. Leave on for 10 minutes  before rinsing. (Patient not taking: Reported on 08/21/2020) 120 mL 1   No current facility-administered medications for this visit.    Allergies Patient has no known allergies.   Physical Exam:  BP 117/78   Pulse 62   Ht 5\' 9"  (1.753 m)   Wt 272 lb 12.8 oz (123.7 kg)   LMP 10/03/2020 (Exact Date)   BMI 40.29 kg/m  Body mass index is 40.29 kg/m.  General appearance: Well nourished, well developed female in no acute distress.  Neck:  Supple, normal appearance, and no thyromegaly  Cardiovascular: normal s1 and s2.  No murmurs, rubs or gallops. Respiratory:  Clear to auscultation bilateral. Normal respiratory effort Abdomen: positive bowel sounds and no masses, hernias; diffusely non tender to palpation, non distended Neuro/Psych:  Normal mood and affect.  Skin:  Warm and dry.   Laboratory: none  Radiology: none  Assessment: pt stable  Plan:  1. Encounter for other general counseling or advice on contraception BTL d/w her, particularly risk of failure, pemrnanecy and surgical risks. Pt would like to proceed. Ligation vs salpingectomy d/w her and she desires salpingectomy; will seek insurance approval but if not then she's okay with filshie clips  Next time in OR that coordinates with urogyn likely mid October which is okay with her  RTC post op  November MD Attending Center for Cornelia Copa [employer])

## 2020-10-28 ENCOUNTER — Ambulatory Visit: Payer: BC Managed Care – PPO | Admitting: Dermatology

## 2020-10-28 ENCOUNTER — Other Ambulatory Visit: Payer: Self-pay

## 2020-10-28 DIAGNOSIS — R21 Rash and other nonspecific skin eruption: Secondary | ICD-10-CM | POA: Diagnosis not present

## 2020-10-28 MED ORDER — CLOBETASOL PROPIONATE 0.05 % EX CREA: TOPICAL_CREAM | CUTANEOUS | 1 refills | Status: DC

## 2020-10-28 NOTE — Progress Notes (Signed)
   Follow-Up Visit   Subjective  Lisa Rich is a 35 y.o. female who presents for the following: Rash (Patient here today concerning a rash on both feet and ankle. Patient states she has been dealing with for several years on and off. Patient reports pcp gave steriod cream to use but patient states it doesn't help. ).    The following portions of the chart were reviewed this encounter and updated as appropriate:       Objective  Well appearing patient in no apparent distress; mood and affect are within normal limits.  A focused examination was performed including bilateral feet and ankles . Relevant physical exam findings are noted in the Assessment and Plan.  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 Condition is bothersome to patient. Currently flared. biopsy today of R dorsal foot to confirm diagnosis.  GA or NLD- No cure, only control.  Start Clobetasol cream apply topically to active rash areas 1 to 2 times daily for 2 weeks. Then use 1 time daily to affected areas of rash for 2 weeks.  30 g 1 rf   Topical steroids (such as triamcinolone, fluocinolone, fluocinonide, mometasone, clobetasol, halobetasol, betamethasone, hydrocortisone) can cause thinning and lightening of the skin if they are used for too long in the same area. Your physician has selected the right strength medicine for your problem and area affected on the body. Please use your medication only as directed by your physician to prevent side effects.        Skin / nail biopsy - bilateral feet and ankles Type of biopsy: punch   Informed consent: discussed and consent obtained   Patient was prepped and draped in usual sterile  fashion: Area prepped with alcohol. Anesthesia: the lesion was anesthetized in a standard fashion   Anesthetic:  1% lidocaine w/ epinephrine 1-100,000 buffered w/ 8.4% NaHCO3 Punch size:  3 mm Suture size:  4-0 Suture type: nylon   Hemostasis achieved with: suture and pressure   Outcome: patient tolerated procedure well   Post-procedure details: wound care instructions given   Post-procedure details comment:  Ointment and small bandage applied.  Additional details:  Pressure dressing applied  clobetasol cream (TEMOVATE) 0.05 % - bilateral feet and ankles Apply topically 1 - 2 times daily to affected areas of rash for 2 weeks. Then use 1 time daily for a few weeks to affected rash areas.  Specimen 1 - Surgical pathology Differential Diagnosis: r/o granuloma annulare vs NLD  Check Margins: No  Annular pink dermal plaque on right dorsal foot  Return for  1 month follow up .  I, Asher Muir, CMA, am acting as scribe for Willeen Niece, MD.  Documentation: I have reviewed the above documentation for accuracy and completeness, and I agree with the above.  Willeen Niece MD

## 2020-10-28 NOTE — Patient Instructions (Addendum)
Topical steroids (such as triamcinolone, fluocinolone, fluocinonide, mometasone, clobetasol, halobetasol, betamethasone, hydrocortisone) can cause thinning and lightening of the skin if they are used for too long in the same area. Your physician has selected the right strength medicine for your problem and area affected on the body. Please use your medication only as directed by your physician to prevent side effects.      Biopsy Wound Care Instructions  Leave the original bandage on for 24 hours if possible.  If the bandage becomes soaked or soiled before that time, it is OK to remove it and examine the wound.  A small amount of post-operative bleeding is normal.  If excessive bleeding occurs, remove the bandage, place gauze over the site and apply continuous pressure (no peeking) over the area for 30 minutes. If this does not work, please call our clinic as soon as possible or page your doctor if it is after hours.   Once a day, cleanse the wound with soap and water. It is fine to shower. If a thick crust develops you may use a Q-tip dipped into dilute hydrogen peroxide (mix 1:1 with water) to dissolve it.  Hydrogen peroxide can slow the healing process, so use it only as needed.    After washing, apply petroleum jelly (Vaseline) or an antibiotic ointment if your doctor prescribed one for you, followed by a bandage.    For best healing, the wound should be covered with a layer of ointment at all times. If you are not able to keep the area covered with a bandage to hold the ointment in place, this may mean re-applying the ointment several times a day.  Continue this wound care until the wound has healed and is no longer open.   Itching and mild discomfort is normal during the healing process. However, if you develop pain or severe itching, please call our office.   If you have any discomfort, you can take Tylenol (acetaminophen) or ibuprofen as directed on the bottle. (Please do not take these if  you have an allergy to them or cannot take them for another reason).  Some redness, tenderness and white or yellow material in the wound is normal healing.  If the area becomes very sore and red, or develops a thick yellow-green material (pus), it may be infected; please notify us.    If you have stitches, return to clinic as directed to have the stitches removed. You will continue wound care for 2-3 days after the stitches are removed.   Wound healing continues for up to one year following surgery. It is not unusual to experience pain in the scar from time to time during the interval.  If the pain becomes severe or the scar thickens, you should notify the office.    A slight amount of redness in a scar is expected for the first six months.  After six months, the redness will fade and the scar will soften and fade.  The color difference becomes less noticeable with time.  If there are any problems, return for a post-op surgery check at your earliest convenience.  To improve the appearance of the scar, you can use silicone scar gel, cream, or sheets (such as Mederma or Serica) every night for up to one year. These are available over the counter (without a prescription).  Please call our office at [phone] for any questions or concerns.  If you have any questions or concerns for your doctor, please call our main line at [phone]   and press option 4 to reach your doctor's medical assistant. If no one answers, please leave a voicemail as directed and we will return your call as soon as possible. Messages left after 4 pm will be answered the following business day.   You may also send us a message via MyChart. We typically respond to MyChart messages within 1-2 business days.  For prescription refills, please ask your pharmacy to contact our office. Our fax number is [phone].  If you have an urgent issue when the clinic is closed that cannot wait until the next business day, you can page  your doctor at the number below.    Please note that while we do our best to be available for urgent issues outside of office hours, we are not available 24/7.   If you have an urgent issue and are unable to reach us, you may choose to seek medical care at your doctor's office, retail clinic, urgent care center, or emergency room.  If you have a medical emergency, please immediately call 911 or go to the emergency department.  Pager Numbers  - Dr. Kowalski: [phone]  - Dr. Moye: [phone]  - Dr. Stewart: [phone]  In the event of inclement weather, please call our main line at [phone] for an update on the status of any delays or closures.  Dermatology Medication Tips: Please keep the boxes that topical medications come in in order to help keep track of the instructions about where and how to use these. Pharmacies typically print the medication instructions only on the boxes and not directly on the medication tubes.   If your medication is too expensive, please contact our office at [phone] option 4 or send us a message through MyChart.   We are unable to tell what your co-pay for medications will be in advance as this is different depending on your insurance coverage. However, we may be able to find a substitute medication at lower cost or fill out paperwork to get insurance to cover a needed medication.   If a prior authorization is required to get your medication covered by your insurance company, please allow us 1-2 business days to complete this process.  Drug prices often vary depending on where the prescription is filled and some pharmacies may offer cheaper prices.  The website www.goodrx.com contains coupons for medications through different pharmacies. The prices here do not account for what the cost may be with help from insurance (it may be cheaper with your insurance), but the website can give you the price if you did not use any insurance.  - You can  print the associated coupon and take it with your prescription to the pharmacy.  - You may also stop by our office during regular business hours and pick up a GoodRx coupon card.  - If you need your prescription sent electronically to a different pharmacy, notify our office through [facility] MyChart or by phone at [phone] option 4.  

## 2020-10-30 ENCOUNTER — Telehealth: Payer: Self-pay

## 2020-10-30 NOTE — Telephone Encounter (Signed)
-----   Message from Willeen Niece, MD sent at 10/30/2020 12:25 PM EDT ----- Skin , bilateral feet and ankles GRANULOMA ANNULARE, PERIPHERAL MARGIN INVOLVED  Benign, continue topical steroid as prescribed - please call patient

## 2020-10-30 NOTE — Telephone Encounter (Signed)
Patient advised of BX results.  °

## 2020-10-30 NOTE — Telephone Encounter (Signed)
Left message for patient to call for biopsy results. 

## 2020-11-03 ENCOUNTER — Ambulatory Visit: Payer: BC Managed Care – PPO | Admitting: Obstetrics and Gynecology

## 2020-11-27 NOTE — Progress Notes (Signed)
[facility] Pre-Operative visit  Subjective Chief Complaint: Lisa Rich presents for a preoperative encounter.   History of Present Illness: Lisa Rich is a 35 y.o. female who presents for preoperative visit.  She is scheduled to undergo laparoscopic bilateral salpingectomy, midurethral sling and cystoscopy on 12/08/20.  Her symptoms include stress urinary incontinence and she desires permanent sterilization.  .    Past Medical History:  Diagnosis Date   Abnormal Pap smear 2007   Anxiety and depression    History of pre-eclampsia 04/19/2011   Hypertension    Pregnancy induced hypertension 2008   preclampsia     Past Surgical History:  Procedure Laterality Date   WISDOM TOOTH EXTRACTION  2011    x 4    has No Known Allergies.   Family History  Problem Relation Age of Onset   Hypertension Mother    Depression Mother    Hypertension Father    Bipolar disorder Father    Diabetes Maternal Grandmother    Asthma Maternal Grandmother    Heart disease Maternal Grandmother        CHF   Heart disease Maternal Grandfather        CHF    Social History   Tobacco Use   Smoking status: Former    Packs/day: 0.00    Types: Cigarettes    Quit date: 01/2015    Years since quitting: 5.8   Smokeless tobacco: Never  Vaping Use   Vaping Use: Never used  Substance Use Topics   Alcohol use: No   Drug use: No     Review of Systems was negative for a full 10 system review except as noted in the History of Present Illness.   Current Outpatient Medications:    ALPRAZolam (XANAX) 0.5 MG tablet, TAKE 1 TABLET BY MOUTH NIGHTLY AS NEEDED FOR ANXIETY, Disp: , Rfl:    busPIRone (BUSPAR) 10 MG tablet, Take 10 mg by mouth 2 (two) times daily., Disp: , Rfl:    clobetasol cream (TEMOVATE) 0.05 %, Apply topically 1 - 2 times daily to affected areas of rash for 2 weeks. Then use 1 time daily for a few weeks to affected rash areas., Disp: 30 g, Rfl: 1    clotrimazole-betamethasone (LOTRISONE) cream, Apply topically 2 (two) times daily., Disp: , Rfl:    ketoconazole (NIZORAL) 2 % shampoo, Apply to body everywhere above waist 3 times per week until follow up. Leave on for 10 minutes before rinsing. (Patient not taking: Reported on 10/28/2020), Disp: 120 mL, Rfl: 1   lamoTRIgine (LAMICTAL) 100 MG tablet, Take 100 mg by mouth daily., Disp: , Rfl:    mirabegron ER (MYRBETRIQ) 25 MG TB24 tablet, Take 1 tablet (25 mg total) by mouth daily., Disp: 30 tablet, Rfl: 5   propranolol ER (INDERAL LA) 60 MG 24 hr capsule, Take 60 mg by mouth daily., Disp: , Rfl:    VRAYLAR 1.5 MG capsule, Take 1.5 mg by mouth daily., Disp: , Rfl:    Objective Today's Vitals   11/28/20 1621  BP: 130/76  Pulse: 80  Weight: 272 lb (123.4 kg)   Body mass index is 40.17 kg/m.  Gen: No distress, AAOx3  Previous Pelvic Exam showed: CST: positive   Speculum exam reveals normal vaginal mucosa without atrophy. Cervix normal appearance. Uterus normal single, nontender. Adnexa no mass, fullness, tenderness.      Assessment/ Plan  Assessment: The patient is a 35 y.o. year old scheduled to undergo laparoscopic bilateral salpingectomy, midurethral  sling and cystoscopy. Verbal consent was obtained for these procedures.  Plan: General Surgical Consent: The patient has previously been counseled on alternative treatments, and the decision by the patient and provider was to proceed with the procedure listed above.  For all procedures, there are risks of bleeding, infection, damage to surrounding organs including but not limited to bowel, bladder, blood vessels, ureters and nerves, and need for further surgery if an injury were to occur. These risks are all low with minimally invasive surgery.   There are risks of numbness and weakness at any body site or buttock/rectal pain.  It is possible that baseline pain can be worsened by surgery, either with or without mesh. If surgery is  vaginal, there is also a low risk of possible conversion to laparoscopy or open abdominal incision where indicated. Very rare risks include blood transfusion, blood clot, heart attack, pneumonia, or death.   There is also a risk of short-term postoperative urinary retention with need to use a catheter. About half of patients need to go home from surgery with a catheter, which is then later removed in the office. The risk of long-term need for a catheter is very low. There is also a risk of worsening of overactive bladder.   Sling: The effectiveness of a midurethral vaginal mesh sling is approximately 85%, and thus, there will be times when you may leak urine after surgery, especially if your bladder is full or if you have a strong cough. There is a balance between making the sling tight enough to treat your leakage but not too tight so that you have long-term difficulty emptying your bladder. A mesh sling will not directly treat overactive bladder/urge incontinence and may worsen it.  There is an FDA safety notification on vaginal mesh procedures for prolapse but NOT mesh slings. We have extensive experience and training with mesh placement and we have close postoperative follow up to identify any potential complications from mesh. It is important to realize that this mesh is a permanent implant that cannot be easily removed. There are rare risks of mesh exposure (2-4%), pain with intercourse (0-7%), and infection (<1%). The risk of mesh exposure if more likely in a woman with risks for poor healing (prior radiation, poorly controlled diabetes, or immunocompromised). The risk of new or worsened chronic pain after mesh implant is more common in women with baseline chronic pain and/or poorly controlled anxiety or depression. Approximately 2-4% of patients will experience longer-term post-operative voiding dysfunction that may require surgical revision of the sling. We also reviewed that postoperatively, her  stream may not be as strong as before surgery.   We discussed consent for blood products. Risks for blood transfusion include allergic reactions, other reactions that can affect different body organs and managed accordingly, transmission of infectious diseases such as HIV or Hepatitis. However, the blood is screened. Patient consents for blood products.  Pre-operative instructions:  She was instructed to not take Aspirin/NSAIDs x 7days prior to surgery. SAntibiotic prophylaxis was ordered as indicated.  Cathter use: Patient will go home with foley if needed after post-operative voiding trial.  Post-operative instructions:  She was provided with specific post-operative instructions, including precautions and signs/symptoms for which we would recommend contacting us, in addition to daytime and after-hours contact phone numbers. This was provided on a handout.   Post-operative medications: Prescriptions for motrin, tylenol, miralax, and oxycodone were sent to her pharmacy. Discussed using ibuprofen and tylenol on a schedule to limit use of narcotics.  Laboratory testing:  Day of surgery UPT   Preoperative clearance:  She does not require surgical clearance.    Post-operative follow-up:  A post-operative appointment will be made for 6 weeks from the date of surgery. If she needs a post-operative nurse visit for a voiding trial, that will be set up after she leaves the hospital.    Patient will call the clinic or use MyChart should anything change or any new issues arise.   Marguerita Beards, MD

## 2020-11-28 ENCOUNTER — Encounter: Payer: Self-pay | Admitting: Obstetrics and Gynecology

## 2020-11-28 ENCOUNTER — Other Ambulatory Visit: Payer: Self-pay

## 2020-11-28 ENCOUNTER — Ambulatory Visit (INDEPENDENT_AMBULATORY_CARE_PROVIDER_SITE_OTHER): Payer: BC Managed Care – PPO | Admitting: Obstetrics and Gynecology

## 2020-11-28 VITALS — BP 130/76 | HR 80 | Wt 272.0 lb

## 2020-11-28 DIAGNOSIS — Z01818 Encounter for other preprocedural examination: Secondary | ICD-10-CM

## 2020-11-28 MED ORDER — POLYETHYLENE GLYCOL 3350 17 GM/SCOOP PO POWD: 17.0000 g | Freq: Every day | ORAL | 0 refills | Status: DC

## 2020-11-28 MED ORDER — IBUPROFEN 600 MG PO TABS: 600.0000 mg | ORAL_TABLET | Freq: Four times a day (QID) | ORAL | 0 refills | Status: DC | PRN

## 2020-11-28 MED ORDER — ACETAMINOPHEN 500 MG PO TABS: 500.0000 mg | ORAL_TABLET | Freq: Four times a day (QID) | ORAL | 0 refills | Status: DC | PRN

## 2020-11-28 MED ORDER — OXYCODONE HCL 5 MG PO TABS
5.0000 mg | ORAL_TABLET | ORAL | 0 refills | Status: DC | PRN
Start: 2020-11-28 — End: 2021-04-21

## 2020-11-28 NOTE — H&P (Signed)
[facility] Pre-Operative H&P  Subjective Chief Complaint: Lisa Rich presents for a preoperative encounter.   History of Present Illness: Lisa Rich is a 35 y.o. female who presents for preoperative visit.  She is scheduled to undergo laparoscopic bilateral salpingectomy, midurethral sling and cystoscopy on 12/08/20.  Her symptoms include stress urinary incontinence and she desires permanent sterilization.  .    Past Medical History:  Diagnosis Date   Abnormal Pap smear 2007   Anxiety and depression    History of pre-eclampsia 04/19/2011   Hypertension    Pregnancy induced hypertension 2008   preclampsia     Past Surgical History:  Procedure Laterality Date   WISDOM TOOTH EXTRACTION  2011    x 4    has No Known Allergies.   Family History  Problem Relation Age of Onset   Hypertension Mother    Depression Mother    Hypertension Father    Bipolar disorder Father    Diabetes Maternal Grandmother    Asthma Maternal Grandmother    Heart disease Maternal Grandmother        CHF   Heart disease Maternal Grandfather        CHF    Social History   Tobacco Use   Smoking status: Former    Packs/day: 0.00    Types: Cigarettes    Quit date: 01/2015    Years since quitting: 5.8   Smokeless tobacco: Never  Vaping Use   Vaping Use: Never used  Substance Use Topics   Alcohol use: No   Drug use: No     Review of Systems was negative for a full 10 system review except as noted in the History of Present Illness.  No current facility-administered medications for this encounter.  Current Outpatient Medications:    acetaminophen (TYLENOL) 500 MG tablet, Take 1 tablet (500 mg total) by mouth every 6 (six) hours as needed (pain)., Disp: 30 tablet, Rfl: 0   ALPRAZolam (XANAX) 0.5 MG tablet, TAKE 1 TABLET BY MOUTH NIGHTLY AS NEEDED FOR ANXIETY, Disp: , Rfl:    busPIRone (BUSPAR) 10 MG tablet, Take 10 mg by mouth 2 (two) times daily., Disp: , Rfl:     clobetasol cream (TEMOVATE) 0.05 %, Apply topically 1 - 2 times daily to affected areas of rash for 2 weeks. Then use 1 time daily for a few weeks to affected rash areas., Disp: 30 g, Rfl: 1   clotrimazole-betamethasone (LOTRISONE) cream, Apply topically 2 (two) times daily., Disp: , Rfl:    ibuprofen (ADVIL) 600 MG tablet, Take 1 tablet (600 mg total) by mouth every 6 (six) hours as needed., Disp: 30 tablet, Rfl: 0   ketoconazole (NIZORAL) 2 % shampoo, Apply to body everywhere above waist 3 times per week until follow up. Leave on for 10 minutes before rinsing., Disp: 120 mL, Rfl: 1   lamoTRIgine (LAMICTAL) 100 MG tablet, Take 100 mg by mouth daily., Disp: , Rfl:    mirabegron ER (MYRBETRIQ) 25 MG TB24 tablet, Take 1 tablet (25 mg total) by mouth daily., Disp: 30 tablet, Rfl: 5   oxyCODONE (OXY IR/ROXICODONE) 5 MG immediate release tablet, Take 1 tablet (5 mg total) by mouth every 4 (four) hours as needed for severe pain., Disp: 10 tablet, Rfl: 0   polyethylene glycol powder (GLYCOLAX/MIRALAX) 17 GM/SCOOP powder, Take 17 g by mouth daily. Drink 17g (1 scoop) dissolved in water per day., Disp: 255 g, Rfl: 0   propranolol ER (INDERAL LA) 60 MG 24  hr capsule, Take 60 mg by mouth daily., Disp: , Rfl:    VRAYLAR 1.5 MG capsule, Take 1.5 mg by mouth daily., Disp: , Rfl:    Objective There were no vitals filed for this visit.  There is no height or weight on file to calculate BMI.  Gen: No distress, AAOx3  Previous Pelvic Exam showed: CST: positive   Speculum exam reveals normal vaginal mucosa without atrophy. Cervix normal appearance. Uterus normal single, nontender. Adnexa no mass, fullness, tenderness.      Assessment/ Plan  Assessment: The patient is a 35 y.o. year old scheduled to undergo laparoscopic bilateral salpingectomy, midurethral sling and cystoscopy.    Marguerita Beards, MD

## 2020-12-01 ENCOUNTER — Other Ambulatory Visit: Payer: Self-pay

## 2020-12-01 ENCOUNTER — Encounter (HOSPITAL_BASED_OUTPATIENT_CLINIC_OR_DEPARTMENT_OTHER): Payer: Self-pay | Admitting: Obstetrics and Gynecology

## 2020-12-01 NOTE — Progress Notes (Addendum)
Spoke w/ via phone for pre-op interview---patient Lab needs dos----ISTAT, EKG, urine pregnancy             Lab results------none COVID test -----patient states asymptomatic no test needed Arrive at -------0530 NPO after MN NO Solid Food.  Clear liquids from MN until---0415 Med rec completed Medications to take morning of surgery -----xanax prn (Patient takes all other meds at night.) Diabetic medication -----none Patient instructed no nail polish to be worn day of surgery Patient instructed to bring photo id and insurance card day of surgery Patient aware to have Driver (ride ) / caregiver    for 24 hours after surgery husband Jomarie Longs Patient Special Instructions -----none Pre-Op special Istructions -----none Patient verbalized understanding of instructions that were given at this phone interview. Patient denies shortness of breath, chest pain, fever, cough at this phone interview.

## 2020-12-03 ENCOUNTER — Ambulatory Visit: Payer: BC Managed Care – PPO | Admitting: Dermatology

## 2020-12-06 NOTE — Anesthesia Preprocedure Evaluation (Addendum)
Anesthesia Evaluation  Patient identified by MRN, date of birth, ID band Patient awake    Reviewed: Allergy & Precautions, NPO status , Patient's Chart, lab work & pertinent test results  Airway Mallampati: II  TM Distance: >3 FB Neck ROM: Full    Dental no notable dental hx.    Pulmonary neg pulmonary ROS, former smoker,    Pulmonary exam normal breath sounds clear to auscultation       Cardiovascular hypertension, Normal cardiovascular exam Rhythm:Regular Rate:Normal     Neuro/Psych Bipolar Disorder negative neurological ROS     GI/Hepatic negative GI ROS, Neg liver ROS,   Endo/Other  Morbid obesity  Renal/GU negative Renal ROS  negative genitourinary   Musculoskeletal negative musculoskeletal ROS (+)   Abdominal   Peds negative pediatric ROS (+)  Hematology negative hematology ROS (+)   Anesthesia Other Findings   Reproductive/Obstetrics negative OB ROS                           Anesthesia Physical Anesthesia Plan  ASA: 2  Anesthesia Plan: General   Post-op Pain Management:    Induction: Intravenous  PONV Risk Score and Plan: 3 and Ondansetron, Dexamethasone, Midazolam and Treatment may vary due to age or medical condition  Airway Management Planned: Oral ETT  Additional Equipment:   Intra-op Plan:   Post-operative Plan: Extubation in OR  Informed Consent: I have reviewed the patients History and Physical, chart, labs and discussed the procedure including the risks, benefits and alternatives for the proposed anesthesia with the patient or authorized representative who has indicated his/her understanding and acceptance.     Dental advisory given  Plan Discussed with: CRNA and Surgeon  Anesthesia Plan Comments:         Anesthesia Quick Evaluation

## 2020-12-08 ENCOUNTER — Ambulatory Visit (HOSPITAL_BASED_OUTPATIENT_CLINIC_OR_DEPARTMENT_OTHER)
Admission: RE | Admit: 2020-12-08 | Discharge: 2020-12-08 | Disposition: A | Discharge status: Home / Self Care | Payer: BC Managed Care – PPO | Attending: Obstetrics and Gynecology | Admitting: Obstetrics and Gynecology

## 2020-12-08 ENCOUNTER — Encounter (HOSPITAL_BASED_OUTPATIENT_CLINIC_OR_DEPARTMENT_OTHER): Payer: Self-pay | Admitting: Obstetrics and Gynecology

## 2020-12-08 ENCOUNTER — Ambulatory Visit (HOSPITAL_BASED_OUTPATIENT_CLINIC_OR_DEPARTMENT_OTHER): Payer: BC Managed Care – PPO | Admitting: Anesthesiology

## 2020-12-08 ENCOUNTER — Telehealth: Payer: Self-pay | Admitting: Obstetrics and Gynecology

## 2020-12-08 ENCOUNTER — Encounter (HOSPITAL_BASED_OUTPATIENT_CLINIC_OR_DEPARTMENT_OTHER)
Admission: RE | Disposition: A | Discharge status: Home / Self Care | Payer: Self-pay | Source: Home / Self Care | Attending: Obstetrics and Gynecology

## 2020-12-08 ENCOUNTER — Other Ambulatory Visit: Payer: Self-pay

## 2020-12-08 DIAGNOSIS — N393 Stress incontinence (female) (male): Secondary | ICD-10-CM

## 2020-12-08 DIAGNOSIS — Z302 Encounter for sterilization: Secondary | ICD-10-CM | POA: Insufficient documentation

## 2020-12-08 DIAGNOSIS — Z87891 Personal history of nicotine dependence: Secondary | ICD-10-CM | POA: Diagnosis not present

## 2020-12-08 DIAGNOSIS — Z79899 Other long term (current) drug therapy: Secondary | ICD-10-CM | POA: Diagnosis not present

## 2020-12-08 HISTORY — PX: CYSTOSCOPY: SHX5120

## 2020-12-08 HISTORY — DX: Bipolar disorder, unspecified: F31.9

## 2020-12-08 HISTORY — PX: BLADDER SUSPENSION: SHX72

## 2020-12-08 HISTORY — PX: TUBAL LIGATION: SHX77

## 2020-12-08 LAB — POCT I-STAT, CHEM 8
BUN: 10 mg/dL (ref 6–20)
Calcium, Ion: 1.2 mmol/L (ref 1.15–1.40)
Chloride: 104 mmol/L (ref 98–111)
Creatinine, Ser: 0.7 mg/dL (ref 0.44–1.00)
Glucose, Bld: 112 mg/dL — ABNORMAL HIGH (ref 70–99)
HCT: 39 % (ref 36.0–46.0)
Hemoglobin: 13.3 g/dL (ref 12.0–15.0)
Potassium: 3.5 mmol/L (ref 3.5–5.1)
Sodium: 141 mmol/L (ref 135–145)
TCO2: 22 mmol/L (ref 22–32)

## 2020-12-08 LAB — POCT PREGNANCY, URINE: Preg Test, Ur: NEGATIVE

## 2020-12-08 SURGERY — TRANSVAGINAL TAPE (TVT) PROCEDURE
Anesthesia: General | Site: Vagina

## 2020-12-08 MED ORDER — DEXAMETHASONE SODIUM PHOSPHATE 10 MG/ML IJ SOLN
INTRAMUSCULAR | Status: DC | PRN
  Administered 2020-12-08: 10 mg via INTRAVENOUS

## 2020-12-08 MED ORDER — DEXMEDETOMIDINE (PRECEDEX) IN NS 20 MCG/5ML (4 MCG/ML) IV SYRINGE
PREFILLED_SYRINGE | INTRAVENOUS | Status: AC
  Filled 2020-12-08: qty 5

## 2020-12-08 MED ORDER — PROPOFOL 10 MG/ML IV BOLUS
INTRAVENOUS | Status: DC | PRN
  Administered 2020-12-08: 150 mg via INTRAVENOUS

## 2020-12-08 MED ORDER — 0.9 % SODIUM CHLORIDE (POUR BTL) OPTIME
TOPICAL | Status: DC | PRN
  Administered 2020-12-08: 500 mL

## 2020-12-08 MED ORDER — PROPOFOL 10 MG/ML IV BOLUS
INTRAVENOUS | Status: AC
  Filled 2020-12-08: qty 20

## 2020-12-08 MED ORDER — CEFAZOLIN SODIUM-DEXTROSE 2-4 GM/100ML-% IV SOLN
2.0000 g | INTRAVENOUS | Status: AC
  Administered 2020-12-08: 2 g via INTRAVENOUS

## 2020-12-08 MED ORDER — SODIUM CHLORIDE 0.9 % IR SOLN
Status: DC | PRN
  Administered 2020-12-08: 1000 mL

## 2020-12-08 MED ORDER — BUPIVACAINE HCL (PF) 0.25 % IJ SOLN
INTRAMUSCULAR | Status: DC | PRN
  Administered 2020-12-08: 8 mL

## 2020-12-08 MED ORDER — CEFAZOLIN SODIUM-DEXTROSE 2-4 GM/100ML-% IV SOLN
INTRAVENOUS | Status: AC
  Filled 2020-12-08: qty 100

## 2020-12-08 MED ORDER — LACTATED RINGERS IV SOLN
INTRAVENOUS | Status: DC
  Administered 2020-12-08: 1000 mL via INTRAVENOUS

## 2020-12-08 MED ORDER — LIDOCAINE 2% (20 MG/ML) 5 ML SYRINGE
INTRAMUSCULAR | Status: DC | PRN
  Administered 2020-12-08: 100 mg via INTRAVENOUS

## 2020-12-08 MED ORDER — POVIDONE-IODINE 10 % EX SWAB: 2.0000 "application " | Freq: Once | CUTANEOUS | Status: DC

## 2020-12-08 MED ORDER — GLYCOPYRROLATE PF 0.2 MG/ML IJ SOSY
PREFILLED_SYRINGE | INTRAMUSCULAR | Status: DC | PRN
  Administered 2020-12-08: .2 mg via INTRAVENOUS

## 2020-12-08 MED ORDER — ROCURONIUM BROMIDE 10 MG/ML (PF) SYRINGE
PREFILLED_SYRINGE | INTRAVENOUS | Status: DC | PRN
  Administered 2020-12-08: 70 mg via INTRAVENOUS
  Administered 2020-12-08: 20 mg via INTRAVENOUS
  Administered 2020-12-08: 10 mg via INTRAVENOUS

## 2020-12-08 MED ORDER — ROCURONIUM BROMIDE 10 MG/ML (PF) SYRINGE
PREFILLED_SYRINGE | INTRAVENOUS | Status: AC
  Filled 2020-12-08: qty 10

## 2020-12-08 MED ORDER — FENTANYL CITRATE (PF) 100 MCG/2ML IJ SOLN
INTRAMUSCULAR | Status: DC | PRN
  Administered 2020-12-08 (×4): 50 ug via INTRAVENOUS

## 2020-12-08 MED ORDER — FENTANYL CITRATE (PF) 250 MCG/5ML IJ SOLN
INTRAMUSCULAR | Status: AC
  Filled 2020-12-08: qty 5

## 2020-12-08 MED ORDER — ONDANSETRON HCL 4 MG/2ML IJ SOLN: 4.0000 mg | Freq: Once | INTRAMUSCULAR | Status: DC | PRN

## 2020-12-08 MED ORDER — ONDANSETRON HCL 4 MG/2ML IJ SOLN
INTRAMUSCULAR | Status: AC
  Filled 2020-12-08: qty 2

## 2020-12-08 MED ORDER — WHITE PETROLATUM EX OINT
TOPICAL_OINTMENT | CUTANEOUS | Status: AC
  Filled 2020-12-08: qty 15

## 2020-12-08 MED ORDER — HEMOSTATIC AGENTS (NO CHARGE) OPTIME
TOPICAL | Status: DC | PRN
  Administered 2020-12-08: 1 via TOPICAL

## 2020-12-08 MED ORDER — DEXMEDETOMIDINE (PRECEDEX) IN NS 20 MCG/5ML (4 MCG/ML) IV SYRINGE
PREFILLED_SYRINGE | INTRAVENOUS | Status: DC | PRN
  Administered 2020-12-08 (×2): 8 ug via INTRAVENOUS

## 2020-12-08 MED ORDER — FENTANYL CITRATE (PF) 100 MCG/2ML IJ SOLN
INTRAMUSCULAR | Status: AC
  Filled 2020-12-08: qty 2

## 2020-12-08 MED ORDER — LIDOCAINE 2% (20 MG/ML) 5 ML SYRINGE
INTRAMUSCULAR | Status: AC
  Filled 2020-12-08: qty 5

## 2020-12-08 MED ORDER — DEXAMETHASONE SODIUM PHOSPHATE 10 MG/ML IJ SOLN
INTRAMUSCULAR | Status: AC
  Filled 2020-12-08: qty 1

## 2020-12-08 MED ORDER — SUGAMMADEX SODIUM 200 MG/2ML IV SOLN
INTRAVENOUS | Status: DC | PRN
  Administered 2020-12-08: 200 mg via INTRAVENOUS

## 2020-12-08 MED ORDER — KETOROLAC TROMETHAMINE 30 MG/ML IJ SOLN
INTRAMUSCULAR | Status: DC | PRN
  Administered 2020-12-08: 30 mg via INTRAVENOUS

## 2020-12-08 MED ORDER — GLYCOPYRROLATE PF 0.2 MG/ML IJ SOSY
PREFILLED_SYRINGE | INTRAMUSCULAR | Status: AC
  Filled 2020-12-08: qty 1

## 2020-12-08 MED ORDER — MIDAZOLAM HCL 2 MG/2ML IJ SOLN
INTRAMUSCULAR | Status: AC
  Filled 2020-12-08: qty 2

## 2020-12-08 MED ORDER — DROPERIDOL 2.5 MG/ML IJ SOLN
INTRAMUSCULAR | Status: DC | PRN
  Administered 2020-12-08: .625 mg via INTRAVENOUS

## 2020-12-08 MED ORDER — KETOROLAC TROMETHAMINE 30 MG/ML IJ SOLN: 30.0000 mg | Freq: Once | INTRAMUSCULAR | Status: DC | PRN

## 2020-12-08 MED ORDER — ONDANSETRON HCL 4 MG/2ML IJ SOLN
INTRAMUSCULAR | Status: DC | PRN
  Administered 2020-12-08: 4 mg via INTRAVENOUS

## 2020-12-08 MED ORDER — FENTANYL CITRATE (PF) 100 MCG/2ML IJ SOLN
25.0000 ug | INTRAMUSCULAR | Status: DC | PRN
  Administered 2020-12-08: 50 ug via INTRAVENOUS

## 2020-12-08 MED ORDER — LIDOCAINE-EPINEPHRINE 1 %-1:100000 IJ SOLN
INTRAMUSCULAR | Status: DC | PRN
  Administered 2020-12-08: 9 mL

## 2020-12-08 MED ORDER — MIDAZOLAM HCL 5 MG/5ML IJ SOLN
INTRAMUSCULAR | Status: DC | PRN
  Administered 2020-12-08: 2 mg via INTRAVENOUS

## 2020-12-08 SURGICAL SUPPLY — 56 items
ADH SKN CLS APL DERMABOND .7 (GAUZE/BANDAGES/DRESSINGS) ×6
AGENT HMST KT MTR STRL THRMB (HEMOSTASIS) ×3
BLADE CLIPPER SENSICLIP SURGIC (BLADE) ×4 IMPLANT
BLADE SURG 15 STRL LF DISP TIS (BLADE) ×3 IMPLANT
BLADE SURG 15 STRL SS (BLADE) ×4
DERMABOND ADVANCED (GAUZE/BANDAGES/DRESSINGS) ×2
DERMABOND ADVANCED .7 DNX12 (GAUZE/BANDAGES/DRESSINGS) ×6 IMPLANT
DURAPREP 26ML APPLICATOR (WOUND CARE) ×4 IMPLANT
ELECT REM PT RETURN 9FT ADLT (ELECTROSURGICAL) ×4
ELECTRODE REM PT RTRN 9FT ADLT (ELECTROSURGICAL) ×3 IMPLANT
GAUZE 4X4 16PLY ~~LOC~~+RFID DBL (SPONGE) ×8 IMPLANT
GLOVE SURG ENC MOIS LTX SZ6 (GLOVE) ×12 IMPLANT
GLOVE SURG POLYISO LF SZ7 (GLOVE) ×4 IMPLANT
GLOVE SURG UNDER POLY LF SZ6 (GLOVE) ×4 IMPLANT
GLOVE SURG UNDER POLY LF SZ6.5 (GLOVE) ×8 IMPLANT
GLOVE SURG UNDER POLY LF SZ7 (GLOVE) ×16 IMPLANT
GLOVE SURG UNDER POLY LF SZ7.5 (GLOVE) ×12 IMPLANT
GOWN STRL REUS W/TWL LRG LVL3 (GOWN DISPOSABLE) ×16 IMPLANT
HIBICLENS CHG 4% 4OZ (MISCELLANEOUS) ×4 IMPLANT
HOLDER FOLEY CATH W/STRAP (MISCELLANEOUS) ×4 IMPLANT
KIT TURNOVER CYSTO (KITS) ×4 IMPLANT
LIGASURE VESSEL 5MM BLUNT TIP (ELECTROSURGICAL) ×4 IMPLANT
MANIFOLD NEPTUNE II (INSTRUMENTS) ×4 IMPLANT
NEEDLE HYPO 22GX1.5 SAFETY (NEEDLE) ×4 IMPLANT
NEEDLE INSUFFLATION 120MM (ENDOMECHANICALS) ×4 IMPLANT
NS IRRIG 500ML POUR BTL (IV SOLUTION) ×4 IMPLANT
PACK LAPAROSCOPY BASIN (CUSTOM PROCEDURE TRAY) ×4 IMPLANT
PACK TRENDGUARD 450 HYBRID PRO (MISCELLANEOUS) ×3 IMPLANT
PACK VAGINAL MINOR WOMEN LF (CUSTOM PROCEDURE TRAY) ×4 IMPLANT
PACK VAGINAL WOMENS (CUSTOM PROCEDURE TRAY) ×4 IMPLANT
PAD OB MATERNITY 4.3X12.25 (PERSONAL CARE ITEMS) ×4 IMPLANT
POUCH LAPAROSCOPIC INSTRUMENT (MISCELLANEOUS) ×4 IMPLANT
RETRACTOR LONE STAR DISPOSABLE (INSTRUMENTS) ×4 IMPLANT
RETRACTOR STAY HOOK 5MM (MISCELLANEOUS) ×16 IMPLANT
SET IRRIG Y TYPE TUR BLADDER L (SET/KITS/TRAYS/PACK) ×4 IMPLANT
SET TUBE SMOKE EVAC HIGH FLOW (TUBING) ×4 IMPLANT
SLEEVE ADV FIXATION 5X100MM (TROCAR) ×8 IMPLANT
SLEEVE SCD COMPRESS KNEE MED (STOCKING) ×4 IMPLANT
SUCTION FRAZIER HANDLE 10FR (MISCELLANEOUS) ×1
SUCTION TUBE FRAZIER 10FR DISP (MISCELLANEOUS) ×3 IMPLANT
SURGIFLO W/THROMBIN 8M KIT (HEMOSTASIS) ×4 IMPLANT
SUT MON AB 4-0 PS1 27 (SUTURE) ×4 IMPLANT
SUT VIC AB 2-0 SH 27 (SUTURE) ×4
SUT VIC AB 2-0 SH 27XBRD (SUTURE) ×3 IMPLANT
SUT VIC AB 3-0 PS2 18 (SUTURE) ×4
SUT VIC AB 3-0 PS2 18XBRD (SUTURE) ×3 IMPLANT
SUT VICRYL 0 UR6 27IN ABS (SUTURE) ×4 IMPLANT
SYR BULB EAR ULCER 3OZ GRN STR (SYRINGE) ×4 IMPLANT
SYS SLING ADV FIT BLUE TRNSVAG (Sling) ×4 IMPLANT
TOWEL OR 17X26 10 PK STRL BLUE (TOWEL DISPOSABLE) ×4 IMPLANT
TRAY FOLEY W/BAG SLVR 14FR LF (SET/KITS/TRAYS/PACK) ×4 IMPLANT
TRENDGUARD 450 HYBRID PRO PACK (MISCELLANEOUS) ×4
TROCAR ADV FIXATION 5X100MM (TROCAR) ×4 IMPLANT
TROCAR BALLN 12MMX100 BLUNT (TROCAR) ×4 IMPLANT
TROCAR BLADELESS OPT 5 150 (ENDOMECHANICALS) ×4 IMPLANT
TUBING TUR DISP (UROLOGICAL SUPPLIES) ×4 IMPLANT

## 2020-12-08 NOTE — Anesthesia Procedure Notes (Signed)
Procedure Name: Intubation Date/Time: 12/08/2020 7:36 AM Performed by: Rogers Blocker, CRNA Pre-anesthesia Checklist: Patient identified, Emergency Drugs available, Suction available and Patient being monitored Patient Re-evaluated:Patient Re-evaluated prior to induction Oxygen Delivery Method: Circle System Utilized Preoxygenation: Pre-oxygenation with 100% oxygen Induction Type: IV induction Ventilation: Mask ventilation without difficulty Laryngoscope Size: Mac and 3 Grade View: Grade I Tube type: Oral Tube size: 7.0 mm Number of attempts: 1 Airway Equipment and Method: Stylet and Bite block Placement Confirmation: ETT inserted through vocal cords under direct vision, positive ETCO2 and breath sounds checked- equal and bilateral Secured at: 22 cm Tube secured with: Tape Dental Injury: Teeth and Oropharynx as per pre-operative assessment

## 2020-12-08 NOTE — Transfer of Care (Signed)
Immediate Anesthesia Transfer of Care Note  Patient: Lisa Rich  Procedure(s) Performed: TRANSVAGINAL TAPE (TVT) PROCEDURE (Vagina ) CYSTOSCOPY (Urethra) BILATERAL SALPINGECTOMY (Abdomen)  Patient Location: PACU  Anesthesia Type:General  Level of Consciousness: awake, alert , oriented and patient cooperative  Airway & Oxygen Therapy: Patient Spontanous Breathing  Post-op Assessment: Report given to RN and Post -op Vital signs reviewed and stable  Post vital signs: Reviewed and stable  Last Vitals:  Vitals Value Taken Time  BP 120/57 12/08/20 0907  Temp 36.4 C 12/08/20 0907  Pulse 74 12/08/20 0911  Resp 18 12/08/20 0911  SpO2 96 % 12/08/20 0911  Vitals shown include unvalidated device data.  Last Pain:  Vitals:   12/08/20 0907  TempSrc:   PainSc: 3       Patients Stated Pain Goal: 5 (12/08/20 0600)  Complications: No notable events documented.

## 2020-12-08 NOTE — Anesthesia Postprocedure Evaluation (Signed)
Anesthesia Post Note  Patient: Lisa Rich  Procedure(s) Performed: TRANSVAGINAL TAPE (TVT) PROCEDURE (Vagina ) CYSTOSCOPY (Urethra) BILATERAL SALPINGECTOMY (Abdomen)     Patient location during evaluation: PACU Anesthesia Type: General Level of consciousness: awake and alert Pain management: pain level controlled Vital Signs Assessment: post-procedure vital signs reviewed and stable Respiratory status: spontaneous breathing, nonlabored ventilation, respiratory function stable and patient connected to nasal cannula oxygen Cardiovascular status: blood pressure returned to baseline and stable Postop Assessment: no apparent nausea or vomiting Anesthetic complications: no   No notable events documented.  Last Vitals:  Vitals:   12/08/20 0915 12/08/20 0930  BP: 117/62 110/65  Pulse: 71 70  Resp: 16 14  Temp:    SpO2: 97% 97%    Last Pain:  Vitals:   12/08/20 0930  TempSrc:   PainSc: 5                  Reiss Mowrey S

## 2020-12-08 NOTE — Interval H&P Note (Signed)
History and Physical Interval Note:  12/08/2020 7:14 AM  Lisa Rich  has presented today for surgery, with the diagnosis of urinary incontinence; encounter for sterilization.  The various methods of treatment have been discussed with the patient and family. After consideration of risks, benefits and other options for treatment, the patient has consented to  Procedure(s): TRANSVAGINAL TAPE (TVT) PROCEDURE (N/A) CYSTOSCOPY (N/A) BILATERAL SALPINGECTOMY (N/A) as a surgical intervention.    Vitals:   12/08/20 0541  BP: (!) 125/55  Pulse: 68  Resp: 18  Temp: 97.8 F (36.6 C)  SpO2: 98%    Gen: NAD CV: no edema Lungs: normal respirations Abd: soft, nontender   The patient's history has been reviewed, patient examined, no change in status, stable for surgery.  I have reviewed the patient's chart and labs.  Questions were answered to the patient's satisfaction.     Marguerita Beards

## 2020-12-08 NOTE — Telephone Encounter (Signed)
Lisa Rich underwent laparoscopic bilateral salpingectomy, midurethral sling and cystoscopy on 12/08/20.   She passed her voiding trial.  was backfilled into the bladder Voided  PVR by bladder scan was 71ml.   She was discharged without a catheter. Please call her for a routine post op check. Thanks!  Marguerita Beards, MD

## 2020-12-08 NOTE — Discharge Instructions (Addendum)

## 2020-12-08 NOTE — Op Note (Addendum)
Operative Note  Preoperative Diagnosis: Stress urinary incontinence and desires permanent sterilization  Postoperative Diagnosis: same  Procedures performed:  Laparoscopic bilateral salpingectomy, midurethral sling (Advantage Fit), cystoscopy  Implants:  Implant Name Type Inv. Item Serial No. Manufacturer Lot No. LRB No. Used Action  advantage fit blue system      [id number] N/A 1 Implanted    Attending Surgeon: Lanetta Inch, MD   Anesthesia: General endotracheal  Findings: 1. Normal appearing uterus, bilateral fallopian tube and ovaries.    2. On cystoscopy, normal bladder and urethra without injury, lesion or foreign body  Specimens:  ID Type Source Tests Collected by Time Destination  1 : bilateral fallopian tubes Tissue PATH Gyn benign resection SURGICAL PATHOLOGY Marguerita Beards, MD 12/08/2020 0818     Estimated blood loss: 100 mL  IV fluids: 1000 mL  Urine output: 100 mL  Complications: none  Procedure in Detail:  The patient was taken to the operating room where she was placed under anesthesia.  She was then placed in the dorsal lithotomy position with allen stirrups and prepped and draped in the usual sterile fashion.  Arms were padded and tucked to the sides. Pads were placed above the shoulders to prevent slippage.  Care was taken to avoid hyperflexion or hyperextension of her upper and lower extremities. A foley catheter was placed.    A speculum was placed into the vagina to visualize the cervix.  The cervix was grasped with a single-tooth tenaculum.  It was sounded to 8 cm.  A Hulka retractor was then placed into the uterus.  The speculum and single-tooth tenaculum were removed.  Gloves and gown were changed and attention was turned to the abdominal portion of the procedure.  The umbilicus was grasped with a Allis clamp.  Towel clamps were placed on either side of the umbilicus to keep it everted.  0.25% marcaine was injected at the umbilicus. An  incision was made at the base of the umbilicus with the fifth and on 11 blade scalpel.  A varies needle was inserted into the peritoneal cavity.  Entry was confirmed with a low opening pressure of CO2.  Once the abdomen was insufflated to 15 mmHg, a 9mm Optiview trocar was placed under direct visualization.  Patient was placed in Trendelenburg position.  A 5 mm incision was made 2 cm superior medial to the ASIS after injection of Marcaine.  A 5 mm trocar was inserted under direct visualization.  An additional 5 mm trocar was placed in the same fashion between the umbilicus and the lateral trocar.  Visualization of the pelvis revealed normal-appearing bilateral fallopian tubes ovaries and uterus.  Left fallopian tube was grasped.  A LigaSure was used to ligate and cut the mesosalpinx to remove the fallopian tube from its attachments.  The fallopian tube was removed from the cornua.  The left loping tube was removed through the trocar.  The same was repeated on the right side to remove the right fallopian tube.  The fallopian tube was removed through the trocar.  Trochars were removed under direct visualization.  The abdominal incisions were closed with a 4-0 Monocryl in a subcuticular fashion.  Were then covered in skin glue.  Attention was then turned to the mid urethral sling.  A lonestar self-retraining retractor was placed with 4 stay hooks.  The Hulka retractor was removed from the cervix.  Good hemostasis at the cervix was noted. The mid urethral area was located on the anterior vaginal wall.  Two Allis  clamps were placed at the level of the midurethra. 1% lidocaine with epinephrine was injected into the vaginal mucosa. A vertical incision was made between the two clamps using a 15-blade scalpel.  Using sharp dissection, Metzenbaum scissors were used to make a periurethral tunnel from the vaginal incision towards the pubic rami bilaterally for the future sling tracts. The bladder was ensured to be empty. The  trocar and attached sling were introduced into the right side of the periurethral vaginal incision, just inferior to the pubic symphysis on the right side. The trocar was guided through the endopelvic fascia and directly vertically.  While hugging the cephalad surface of the pubic bone, the trocar was guided out through the abdomen 2 fingerbreadths lateral to midline at the level of the pubic symphysis on the ipsilateral side. The trocar was placed on the left side in a similar fashion.  A 70-degree cystoscope was introduced, and 360-degree inspection revealed no trauma or trocars in the bladder, with brisk bilateral ureteral efflux.  The bladder was drained and the cystoscope was removed.  The Foley catheter was reinserted.  The sling was brought to lie beneath the mid-urethra.  A needle driver was placed behind the sling to ensure no tension.   The plastic sheath was removed from the sling and the distal ends of the sling were trimmed just below the level of the skin incisions. Hemostasis was noted.  Tension-free positioning of the sling was confirmed. Vaginal inspection revealed no vaginotomy or sling perforations of the mucosa.  Surgiflo was injected into the bilateral periurethral tunnels for hemostasis.  Good hemostasis was noted. The vaginal mucosal edges were reapproximated using 2-0 Vicryl.   Hemostasis was again noted. Vaginal packing not placed. The suprapubic sling incisions were closed with Dermabond. The patient tolerated the procedure well.  She was awakened from anesthesia and transferred to the recovery room in stable condition. All needle and sponge counts were correct x 2.     Marguerita Beards, MD

## 2020-12-09 ENCOUNTER — Encounter (HOSPITAL_BASED_OUTPATIENT_CLINIC_OR_DEPARTMENT_OTHER): Payer: Self-pay | Admitting: Obstetrics and Gynecology

## 2020-12-09 ENCOUNTER — Encounter: Payer: Self-pay | Admitting: *Deleted

## 2020-12-09 LAB — SURGICAL PATHOLOGY

## 2020-12-09 NOTE — Telephone Encounter (Signed)
Post- Op Call  Lisa Rich underwent laparoscopic bilateral salpingectomy, midurethral sling, and cystoscopy on 12/08/20 with Dr Florian Buff. The patient reports that her pain is controlled. She is taking ibuprofen and tylenol alternating. She reports vaginal bleeding that she describes as spotting. She has had a bowel movement and is taking miralax for a bowel regimen. She was discharged without a catheter and states that she is voiding without difficulty. Advised to call the office with any problems or concerns.   Harrie Jeans, RN

## 2020-12-10 ENCOUNTER — Encounter (HOSPITAL_BASED_OUTPATIENT_CLINIC_OR_DEPARTMENT_OTHER): Payer: Self-pay | Admitting: Obstetrics and Gynecology

## 2021-01-19 NOTE — Progress Notes (Signed)
[facility]  Date of Visit: 01/20/2021  History of Present Illness: Lisa Rich is a 35 y.o. female scheduled today for a post-operative visit.   Surgery: s/p Laparoscopic bilateral salpingectomy, midurethral sling (Advantage Fit), cystoscopy on 12/08/20  She passed her postoperative void trial.   Postoperative course has been uncomplicated.   Today she reports still has occasional leakage of urine- usually only when she has a full bladder. Otherwise has not leaked with cough/ laugh/ sneeze.   UTI in the last 6 weeks? No   Pathology results: FALLOPIAN TUBES, BILATERAL, SALPINGECTOMY:  - Benign fallopian tubes with benign, bilateral paratubal cysts.   Medications: She has a current medication list which includes the following prescription(s): mirabegron er, acetaminophen, alprazolam, buspirone, clobetasol cream, ibuprofen, lamotrigine, oxycodone, polyethylene glycol powder, propranolol er, and vraylar.   Allergies: Patient has No Known Allergies.   Physical Exam: BP 134/86   Pulse 79   Wt 295 lb (133.8 kg)   LMP 12/01/2020   BMI 42.33 kg/m   Abdomen: soft, non-tender, without masses or organomegaly 3 laparoscopic Incisions: healing well.  Pelvic Examination: Vagina: small 1-2 mm mesh exposure at incision line   ---------------------------------------------------------  Assessment and Plan:  1. Overactive bladder   2. Exposure of implanted urethral mesh, initial encounter (HCC)   3. Post-operative state     - For OAB, will increase Myrbetriq to 50mg  daily.  - We discussed the options of vaginal estrogen vs mesh revision for the suburethral mesh exposure. She prefers to have the mesh revision. We discussed that will likely try to avoid any mesh removal since she has had good results from the sling for her leakage, and excision of the mesh can cause incontinence to return.  - Can resume regular activity including exercise but wait for intercourse due to mesh  exposure  Will schedule procedure.   , MD

## 2021-01-20 ENCOUNTER — Encounter: Payer: Self-pay | Admitting: Obstetrics and Gynecology

## 2021-01-20 ENCOUNTER — Other Ambulatory Visit: Payer: Self-pay

## 2021-01-20 ENCOUNTER — Ambulatory Visit (INDEPENDENT_AMBULATORY_CARE_PROVIDER_SITE_OTHER): Payer: BC Managed Care – PPO | Admitting: Obstetrics and Gynecology

## 2021-01-20 VITALS — BP 134/86 | HR 79 | Wt 295.0 lb

## 2021-01-20 DIAGNOSIS — N3281 Overactive bladder: Secondary | ICD-10-CM

## 2021-01-20 DIAGNOSIS — T83722A Exposure of implanted urethral mesh into urethra, initial encounter: Secondary | ICD-10-CM

## 2021-01-20 DIAGNOSIS — Z9889 Other specified postprocedural states: Secondary | ICD-10-CM

## 2021-01-20 MED ORDER — MIRABEGRON ER 50 MG PO TB24: 50.0000 mg | ORAL_TABLET | Freq: Every day | ORAL | 5 refills | Status: DC

## 2021-02-09 ENCOUNTER — Other Ambulatory Visit: Payer: Self-pay

## 2021-02-09 ENCOUNTER — Encounter (HOSPITAL_BASED_OUTPATIENT_CLINIC_OR_DEPARTMENT_OTHER): Payer: Self-pay | Admitting: Obstetrics and Gynecology

## 2021-02-09 NOTE — H&P (Signed)
[facility] Pre-Operative H&P  Subjective Chief Complaint: Lisa Rich presents for a preoperative encounter.   History of Present Illness: Lisa Rich is a 35 y.o. female who presents for preoperative visit.  She is scheduled to undergo sling mesh revision and possible cystoscopy on 02/09/21.  She initially had a midurethral sling placed on 12/08/20 and was noted to have mesh exposure on vaginal exam at the 6 week post op visit.  Marland Kitchen    Past Medical History:  Diagnosis Date   Abnormal Pap smear 2007   Anxiety and depression    Bipolar 1 disorder (HCC)    History of pre-eclampsia 04/19/2011   Hypertension    Pregnancy induced hypertension 2008   preclampsia     Past Surgical History:  Procedure Laterality Date   BLADDER SUSPENSION N/A 12/08/2020   Procedure: TRANSVAGINAL TAPE (TVT) PROCEDURE;  Surgeon: Marguerita Beards, MD;  Location: [facility];  Service: Gynecology;  Laterality: N/A;   CYSTOSCOPY N/A 12/08/2020   Procedure: CYSTOSCOPY;  Surgeon: Marguerita Beards, MD;  Location: [facility];  Service: Gynecology;  Laterality: N/A;   TUBAL LIGATION N/A 12/08/2020   Procedure: BILATERAL SALPINGECTOMY;  Surgeon: Marguerita Beards, MD;  Location: [facility];  Service: Gynecology;  Laterality: N/A;   WISDOM TOOTH EXTRACTION  2011    x 4    has No Known Allergies.   Family History  Problem Relation Age of Onset   Hypertension Mother    Depression Mother    Hypertension Father    Bipolar disorder Father    Diabetes Maternal Grandmother    Asthma Maternal Grandmother    Heart disease Maternal Grandmother        CHF   Heart disease Maternal Grandfather        CHF    Social History   Tobacco Use   Smoking status: Former    Packs/day: 1.00    Years: 15.00    Pack years: 15.00    Types: Cigarettes    Quit date: 2020    Years since quitting: 2.9   Smokeless tobacco: Never  Vaping Use    Vaping Use: Never used  Substance Use Topics   Alcohol use: No   Drug use: No    Comment: smoked marijuana as a teenager     Review of Systems was negative for a full 10 system review except as noted in the History of Present Illness.  No current facility-administered medications for this encounter.  Current Outpatient Medications:    acetaminophen (TYLENOL) 500 MG tablet, Take 1 tablet (500 mg total) by mouth every 6 (six) hours as needed (pain)., Disp: 30 tablet, Rfl: 0   ALPRAZolam (XANAX) 0.5 MG tablet, at bedtime as needed., Disp: , Rfl:    busPIRone (BUSPAR) 10 MG tablet, Take 10 mg by mouth every evening., Disp: , Rfl:    clobetasol cream (TEMOVATE) 0.05 %, Apply topically 1 - 2 times daily to affected areas of rash for 2 weeks. Then use 1 time daily for a few weeks to affected rash areas. (Patient taking differently: as needed. Apply topically 1 - 2 times daily to affected areas of rash for 2 weeks. Then use 1 time daily for a few weeks to affected rash areas.), Disp: 30 g, Rfl: 1   ibuprofen (ADVIL) 600 MG tablet, Take 1 tablet (600 mg total) by mouth every 6 (six) hours as needed., Disp: 30 tablet, Rfl: 0   lamoTRIgine (LAMICTAL)  100 MG tablet, Take 100 mg by mouth every evening., Disp: , Rfl:    mirabegron ER (MYRBETRIQ) 50 MG TB24 tablet, Take 1 tablet (50 mg total) by mouth daily., Disp: 30 tablet, Rfl: 5   oxyCODONE (OXY IR/ROXICODONE) 5 MG immediate release tablet, Take 1 tablet (5 mg total) by mouth every 4 (four) hours as needed for severe pain., Disp: 10 tablet, Rfl: 0   polyethylene glycol powder (GLYCOLAX/MIRALAX) 17 GM/SCOOP powder, Take 17 g by mouth daily. Drink 17g (1 scoop) dissolved in water per day., Disp: 255 g, Rfl: 0   propranolol ER (INDERAL LA) 60 MG 24 hr capsule, Take 60 mg by mouth daily., Disp: , Rfl:    VRAYLAR 1.5 MG capsule, Take 1.5 mg by mouth every evening., Disp: , Rfl:    Objective BP 134/86    Pulse 79    Wt 295 lb (133.8 kg)    LMP 12/01/2020     BMI 42.33 kg/m   Abdomen: soft, non-tender, without masses or organomegaly 3 laparoscopic Incisions: healing well.  Pelvic Examination: Vagina: small 1-2 mm mesh exposure at incision line     Assessment/ Plan  Assessment: The patient is a 35 y.o. year old s/p midurethral sling for SUI scheduled to undergo sling mesh revision and possible cystoscopy.   Marguerita Beards, MD

## 2021-02-09 NOTE — Progress Notes (Addendum)
Spoke w/ via phone for pre-op interview---pt Lab needs dos----ISTAT, urine pregnancy POCT per anesthesia, surgeon's orders pending, requested via Epic IB on 02/09/21             Lab results------12/08/20 EKG in Epic & chart COVID test -----patient states asymptomatic no test needed Arrive at -------1130 on 02/13/21 NPO after MN NO Solid Food.  Clear liquids from MN until---1030 Med rec completed Medications to take morning of surgery -----Xanax prn Diabetic medication -----n/a Patient instructed no nail polish to be worn day of surgery Patient instructed to bring photo id and insurance card day of surgery Patient aware to have Driver (ride ) / caregiver    for 24 hours after surgery - Lisa Rich Patient Special Instructions -----none Pre-Op special Istructions -----none Patient verbalized understanding of instructions that were given at this phone interview. Patient denies shortness of breath, chest pain, fever, cough at this phone interview.

## 2021-02-13 ENCOUNTER — Encounter (HOSPITAL_BASED_OUTPATIENT_CLINIC_OR_DEPARTMENT_OTHER): Payer: Self-pay | Admitting: Obstetrics and Gynecology

## 2021-02-13 ENCOUNTER — Ambulatory Visit (HOSPITAL_BASED_OUTPATIENT_CLINIC_OR_DEPARTMENT_OTHER)
Admission: RE | Admit: 2021-02-13 | Discharge: 2021-02-13 | Disposition: A | Discharge status: Home / Self Care | Payer: BC Managed Care – PPO | Attending: Obstetrics and Gynecology | Admitting: Obstetrics and Gynecology

## 2021-02-13 ENCOUNTER — Ambulatory Visit (HOSPITAL_BASED_OUTPATIENT_CLINIC_OR_DEPARTMENT_OTHER): Payer: BC Managed Care – PPO | Admitting: Anesthesiology

## 2021-02-13 ENCOUNTER — Encounter (HOSPITAL_BASED_OUTPATIENT_CLINIC_OR_DEPARTMENT_OTHER)
Admission: RE | Disposition: A | Discharge status: Home / Self Care | Payer: Self-pay | Source: Home / Self Care | Attending: Obstetrics and Gynecology

## 2021-02-13 ENCOUNTER — Other Ambulatory Visit: Payer: Self-pay

## 2021-02-13 DIAGNOSIS — Z87891 Personal history of nicotine dependence: Secondary | ICD-10-CM | POA: Diagnosis not present

## 2021-02-13 DIAGNOSIS — N393 Stress incontinence (female) (male): Secondary | ICD-10-CM | POA: Insufficient documentation

## 2021-02-13 DIAGNOSIS — X58XXXA Exposure to other specified factors, initial encounter: Secondary | ICD-10-CM | POA: Diagnosis not present

## 2021-02-13 DIAGNOSIS — T83722A Exposure of implanted urethral mesh into urethra, initial encounter: Secondary | ICD-10-CM | POA: Insufficient documentation

## 2021-02-13 HISTORY — PX: EXCISION OF MESH: SHX6268

## 2021-02-13 LAB — POCT I-STAT, CHEM 8
BUN: 13 mg/dL (ref 6–20)
Calcium, Ion: 1.27 mmol/L (ref 1.15–1.40)
Chloride: 107 mmol/L (ref 98–111)
Creatinine, Ser: 0.6 mg/dL (ref 0.44–1.00)
Glucose, Bld: 98 mg/dL (ref 70–99)
HCT: 40 % (ref 36.0–46.0)
Hemoglobin: 13.6 g/dL (ref 12.0–15.0)
Potassium: 3.8 mmol/L (ref 3.5–5.1)
Sodium: 141 mmol/L (ref 135–145)
TCO2: 20 mmol/L — ABNORMAL LOW (ref 22–32)

## 2021-02-13 LAB — POCT PREGNANCY, URINE: Preg Test, Ur: NEGATIVE

## 2021-02-13 SURGERY — REMOVAL, MESH, ABDOMEN OR PELVIS
Anesthesia: General

## 2021-02-13 MED ORDER — FENTANYL CITRATE (PF) 100 MCG/2ML IJ SOLN
INTRAMUSCULAR | Status: AC
  Filled 2021-02-13: qty 2

## 2021-02-13 MED ORDER — PROMETHAZINE HCL 25 MG/ML IJ SOLN: 6.2500 mg | INTRAMUSCULAR | Status: DC | PRN

## 2021-02-13 MED ORDER — PROPOFOL 10 MG/ML IV BOLUS
INTRAVENOUS | Status: DC | PRN
  Administered 2021-02-13: 200 mg via INTRAVENOUS

## 2021-02-13 MED ORDER — KETOROLAC TROMETHAMINE 30 MG/ML IJ SOLN: 30.0000 mg | Freq: Once | INTRAMUSCULAR | Status: DC | PRN

## 2021-02-13 MED ORDER — ONDANSETRON HCL 4 MG/2ML IJ SOLN
INTRAMUSCULAR | Status: DC | PRN
  Administered 2021-02-13: 4 mg via INTRAVENOUS

## 2021-02-13 MED ORDER — CEFAZOLIN IN SODIUM CHLORIDE 3-0.9 GM/100ML-% IV SOLN
INTRAVENOUS | Status: AC
  Filled 2021-02-13: qty 100

## 2021-02-13 MED ORDER — ACETAMINOPHEN 500 MG PO TABS
ORAL_TABLET | ORAL | Status: AC
  Filled 2021-02-13: qty 2

## 2021-02-13 MED ORDER — CEFAZOLIN IN SODIUM CHLORIDE 3-0.9 GM/100ML-% IV SOLN
3.0000 g | INTRAVENOUS | Status: AC
  Administered 2021-02-13: 13:00:00 3 g via INTRAVENOUS

## 2021-02-13 MED ORDER — MIDAZOLAM HCL 2 MG/2ML IJ SOLN
INTRAMUSCULAR | Status: AC
  Filled 2021-02-13: qty 2

## 2021-02-13 MED ORDER — DEXAMETHASONE SODIUM PHOSPHATE 10 MG/ML IJ SOLN
INTRAMUSCULAR | Status: DC | PRN
Start: 2021-02-13 — End: 2021-02-13
  Administered 2021-02-13 (×2): 5 mg via INTRAVENOUS

## 2021-02-13 MED ORDER — DEXAMETHASONE SODIUM PHOSPHATE 10 MG/ML IJ SOLN
INTRAMUSCULAR | Status: AC
  Filled 2021-02-13: qty 1

## 2021-02-13 MED ORDER — KETOROLAC TROMETHAMINE 30 MG/ML IJ SOLN
INTRAMUSCULAR | Status: DC | PRN
  Administered 2021-02-13: 30 mg via INTRAVENOUS

## 2021-02-13 MED ORDER — OXYCODONE HCL 5 MG/5ML PO SOLN: 5.0000 mg | Freq: Once | ORAL | Status: DC | PRN

## 2021-02-13 MED ORDER — LIDOCAINE 2% (20 MG/ML) 5 ML SYRINGE
INTRAMUSCULAR | Status: DC | PRN
  Administered 2021-02-13: 60 mg via INTRAVENOUS

## 2021-02-13 MED ORDER — LIDOCAINE-EPINEPHRINE 1 %-1:100000 IJ SOLN
INTRAMUSCULAR | Status: DC | PRN
  Administered 2021-02-13: 3 mL

## 2021-02-13 MED ORDER — PROPOFOL 10 MG/ML IV BOLUS
INTRAVENOUS | Status: AC
  Filled 2021-02-13: qty 20

## 2021-02-13 MED ORDER — FENTANYL CITRATE (PF) 100 MCG/2ML IJ SOLN: 25.0000 ug | INTRAMUSCULAR | Status: DC | PRN

## 2021-02-13 MED ORDER — LACTATED RINGERS IV SOLN: INTRAVENOUS | Status: DC

## 2021-02-13 MED ORDER — FENTANYL CITRATE (PF) 100 MCG/2ML IJ SOLN
INTRAMUSCULAR | Status: DC | PRN
  Administered 2021-02-13 (×3): 50 ug via INTRAVENOUS

## 2021-02-13 MED ORDER — OXYCODONE HCL 5 MG PO TABS: 5.0000 mg | ORAL_TABLET | Freq: Once | ORAL | Status: DC | PRN

## 2021-02-13 MED ORDER — MIDAZOLAM HCL 2 MG/2ML IJ SOLN
INTRAMUSCULAR | Status: DC | PRN
  Administered 2021-02-13: 2 mg via INTRAVENOUS

## 2021-02-13 MED ORDER — LIDOCAINE 2% (20 MG/ML) 5 ML SYRINGE
INTRAMUSCULAR | Status: AC
  Filled 2021-02-13: qty 5

## 2021-02-13 MED ORDER — ONDANSETRON HCL 4 MG/2ML IJ SOLN
INTRAMUSCULAR | Status: AC
  Filled 2021-02-13: qty 2

## 2021-02-13 MED ORDER — KETOROLAC TROMETHAMINE 30 MG/ML IJ SOLN
INTRAMUSCULAR | Status: AC
  Filled 2021-02-13: qty 1

## 2021-02-13 MED ORDER — AMISULPRIDE (ANTIEMETIC) 5 MG/2ML IV SOLN: 10.0000 mg | Freq: Once | INTRAVENOUS | Status: DC | PRN

## 2021-02-13 MED ORDER — ACETAMINOPHEN 500 MG PO TABS
1000.0000 mg | ORAL_TABLET | Freq: Once | ORAL | Status: AC
  Administered 2021-02-13: 12:00:00 1000 mg via ORAL

## 2021-02-13 SURGICAL SUPPLY — 34 items
APPLICATOR SURGIFLO ENDO (HEMOSTASIS) IMPLANT
BLADE CLIPPER SENSICLIP SURGIC (BLADE) ×3 IMPLANT
BLADE SURG 15 STRL LF DISP TIS (BLADE) ×1 IMPLANT
BLADE SURG 15 STRL SS (BLADE) ×2
DECANTER SPIKE VIAL GLASS SM (MISCELLANEOUS) ×1 IMPLANT
DERMABOND ADVANCED (GAUZE/BANDAGES/DRESSINGS) ×2
DERMABOND ADVANCED .7 DNX12 (GAUZE/BANDAGES/DRESSINGS) ×1 IMPLANT
DEVICE CAPIO SLIM SINGLE (INSTRUMENTS) IMPLANT
GAUZE 4X4 16PLY ~~LOC~~+RFID DBL (SPONGE) ×3 IMPLANT
GLOVE SURG ENC MOIS LTX SZ6 (GLOVE) ×3 IMPLANT
GLOVE SURG UNDER POLY LF SZ6.5 (GLOVE) ×3 IMPLANT
GOWN STRL REUS W/TWL LRG LVL3 (GOWN DISPOSABLE) ×3 IMPLANT
HIBICLENS CHG 4% 4OZ BTL (MISCELLANEOUS) ×3 IMPLANT
HOLDER FOLEY CATH W/STRAP (MISCELLANEOUS) ×3 IMPLANT
KIT TURNOVER CYSTO (KITS) ×3 IMPLANT
NEEDLE HYPO 22GX1.5 SAFETY (NEEDLE) ×3 IMPLANT
NS IRRIG 1000ML POUR BTL (IV SOLUTION) ×3 IMPLANT
PACK VAGINAL WOMENS (CUSTOM PROCEDURE TRAY) ×3 IMPLANT
RETRACTOR LONE STAR DISPOSABLE (INSTRUMENTS) ×3 IMPLANT
RETRACTOR STAY HOOK 5MM (MISCELLANEOUS) ×3 IMPLANT
SET IRRIG Y TYPE TUR BLADDER L (SET/KITS/TRAYS/PACK) ×3 IMPLANT
SUCTION FRAZIER HANDLE 10FR (MISCELLANEOUS) ×2
SUCTION TUBE FRAZIER 10FR DISP (MISCELLANEOUS) ×1 IMPLANT
SURGIFLO W/THROMBIN 8M KIT (HEMOSTASIS) IMPLANT
SUT ABS MONO DBL WITH NDL 48IN (SUTURE) IMPLANT
SUT MON AB 2-0 SH 27 (SUTURE) ×2 IMPLANT
SUT VIC AB 0 CT1 27 (SUTURE)
SUT VIC AB 0 CT1 27XBRD ANTBC (SUTURE) IMPLANT
SUT VIC AB 2-0 SH 27 (SUTURE) ×2
SUT VIC AB 2-0 SH 27XBRD (SUTURE) ×1 IMPLANT
SUT VICRYL 2-0 SH 8X27 (SUTURE) IMPLANT
SYR BULB EAR ULCER 3OZ GRN STR (SYRINGE) ×3 IMPLANT
TOWEL OR 17X26 10 PK STRL BLUE (TOWEL DISPOSABLE) ×3 IMPLANT
TRAY FOLEY W/BAG SLVR 14FR (SET/KITS/TRAYS/PACK) ×3 IMPLANT

## 2021-02-13 NOTE — Anesthesia Postprocedure Evaluation (Signed)
Anesthesia Post Note  Patient: Lisa Rich  Procedure(s) Performed: mesh revision     Patient location during evaluation: PACU Anesthesia Type: General Level of consciousness: awake Pain management: pain level controlled Vital Signs Assessment: post-procedure vital signs reviewed and stable Respiratory status: spontaneous breathing, nonlabored ventilation, respiratory function stable and patient connected to nasal cannula oxygen Cardiovascular status: blood pressure returned to baseline and stable Postop Assessment: no apparent nausea or vomiting Anesthetic complications: no   No notable events documented.  Last Vitals:  Vitals:   02/13/21 1430 02/13/21 1458  BP: 112/72 132/88  Pulse: 68 (!) 59  Resp: 14 19  Temp:    SpO2: 98% 100%    Last Pain:  Vitals:   02/13/21 1458  TempSrc:   PainSc: 0-No pain                 Pascal Stiggers P Otho Michalik

## 2021-02-13 NOTE — Discharge Instructions (Addendum)
POST OPERATIVE INSTRUCTIONS  General Instructions Recovery (not bed rest) will last approximately 6 weeks Walking is encouraged, but refrain from strenuous exercise/ housework/ heavy lifting. No lifting >10lbs  Nothing in the vagina- NO intercourse, tampons or douching Bathing:  Do not submerge in water (NO swimming, bath, hot tub, etc) until after your postop visit. You can shower starting the day after surgery.  No driving until your pain is well enough controlled that you can slam on the breaks or make sudden movements if needed.   Taking your medications Please take your acetaminophen and ibuprofen on a schedule for the first 48 hours. Take 600mg  ibuprofen, then take 500mg  acetaminophen 3 hours later, then continue to alternate ibuprofen and acetaminophen. That way you are taking each type of medication every 6 hours.  If needed, take a stool softener daily to keep your stools soft and preventing you from straining. If you have diarrhea, you decrease your stool softener. This is explained more below. We have prescribed you Miralax.  Reasons to Call the Nurse (see last page for phone numbers) Heavy Bleeding (changing your pad every 1-2 hours) Persistent nausea/vomiting Fever (100.4 degrees or more) Incision problems (pus or other fluid coming out, redness, warmth, increased pain)  Things to Expect After Surgery Mild to Moderate pain is normal during the first day or two after surgery. If prescribed, take Ibuprofen or Tylenol first and use the stronger medicine for break-through pain. You can overlap these medicines because they work differently.   Constipation   To Prevent Constipation:  Eat a well-balanced diet including protein, grains, fresh fruit and vegetables.  Drink plenty of fluids. Walk regularly.  Depending on specific instructions from your physician: take Miralax daily and additionally you can add a stool softener (colace/ docusate) and fiber supplement. Continue as long as  you're on pain medications.   To Treat Constipation:  If you do not have a bowel movement in 2 days after surgery, you can take 2 Tbs of Milk of Magnesia 1-2 times a day until you have a bowel movement. If diarrhea occurs, decrease the amount or stop the laxative. If no results with Milk of Magnesia, you can drink a bottle of magnesium citrate which you can purchase over the counter.  Fatigue:  This is a normal response to surgery and will improve with time.  Plan frequent rest periods throughout the day.  Gas Pain:  This is very common but can also be very painful! Drink warm liquids such as herbal teas, bouillon or soup. Walking will help you pass more gas.  Mylicon or Gas-X can be taken over the counter.  Leaking Urine:  Varying amounts of leakage may occur after surgery.  This should improve with time. Your bladder needs at least 3 months to recover from surgery. If you leak after surgery, be sure to mention this to your doctor at your post-op visit. If you were taking medications for overactive bladder prior to surgery, be sure to restart the medications immediately after surgery.  Incisions: If you have incisions on your abdomen, the skin glue will dissolve on its own over time. It is ok to gently rinse with soap and water over these incisions but do not scrub.  Catheter  If you go home with a catheter, the office will call to set up a voiding trial a few days after surgery. For most patients, by this visit, they are able to urinate on their own. Long term catheter use is rare.   Return  to Work  As work demands and recovery times vary widely, it is hard to predict when you will want to return to work. If you have a desk job with no strenuous physical activity, and if you would like to return sooner than generally recommended, discuss this with your provider or call our office.   Post op concerns  For non-emergent issues, please call the Urogynecology Nurse. Please leave a message and  someone will contact you within one business day.  You can also send a message through MyChart.   AFTER HOURS (After 5:00 PM and on weekends):  For urgent matters that cannot wait until the next business day. Call our office [phone] and connect to the doctor on call.  Please reserve this for important issues.   **FOR ANY TRUE EMERGENCY ISSUES CALL 911 OR GO TO THE NEAREST EMERGENCY ROOM.** Please inform our office or the doctor on call of any emergency.     APPOINTMENTS: Call [phone]    Post Anesthesia Home Care Instructions  Activity: Get plenty of rest for the remainder of the day. A responsible individual must stay with you for 24 hours following the procedure.  For the next 24 hours, DO NOT: -Drive a car -Advertising copywriter -Drink alcoholic beverages -Take any medication unless instructed by your physician -Make any legal decisions or sign important papers.  Meals: Start with liquid foods such as gelatin or soup. Progress to regular foods as tolerated. Avoid greasy, spicy, heavy foods. If nausea and/or vomiting occur, drink only clear liquids until the nausea and/or vomiting subsides. Call your physician if vomiting continues.  Special Instructions/Symptoms: Your throat may feel dry or sore from the anesthesia or the breathing tube placed in your throat during surgery. If this causes discomfort, gargle with warm salt water. The discomfort should disappear within 24 hours.

## 2021-02-13 NOTE — Interval H&P Note (Signed)
History and Physical Interval Note:  02/13/2021 1:16 PM  Lisa Rich  has presented today for surgery, with the diagnosis of exposure of urethral mesh.  The various methods of treatment have been discussed with the patient and family. After consideration of risks, benefits and other options for treatment, the patient has consented to  Procedure(s) with comments: mesh revision (N/A) - total time requested is as a surgical intervention.  The patient's history has been reviewed, patient examined, no change in status, stable for surgery.  I have reviewed the patient's chart and labs.  Questions were answered to the patient's satisfaction.     Marguerita Beards

## 2021-02-13 NOTE — Anesthesia Procedure Notes (Signed)
Procedure Name: LMA Insertion Date/Time: 02/13/2021 1:26 PM Performed by: Francie Massing, CRNA Pre-anesthesia Checklist: Patient identified, Emergency Drugs available, Suction available and Patient being monitored Patient Re-evaluated:Patient Re-evaluated prior to induction Oxygen Delivery Method: Circle system utilized Preoxygenation: Pre-oxygenation with 100% oxygen Induction Type: IV induction Ventilation: Mask ventilation without difficulty LMA: LMA inserted LMA Size: 4.0 Number of attempts: 1 Airway Equipment and Method: Bite block Placement Confirmation: positive ETCO2 Tube secured with: Tape Dental Injury: Teeth and Oropharynx as per pre-operative assessment

## 2021-02-13 NOTE — Transfer of Care (Signed)
Immediate Anesthesia Transfer of Care Note  Patient: Lisa Rich  Procedure(s) Performed: mesh revision  Patient Location: PACU  Anesthesia Type:General  Level of Consciousness: awake, alert , oriented and patient cooperative  Airway & Oxygen Therapy: Patient Spontanous Breathing and Patient connected to nasal cannula oxygen  Post-op Assessment: Report given to RN and Post -op Vital signs reviewed and stable  Post vital signs: Reviewed and stable  Last Vitals:  Vitals Value Taken Time  BP    Temp    Pulse    Resp 15 02/13/21 1405  SpO2    Vitals shown include unvalidated device data.  Last Pain:  Vitals:   02/13/21 1152  TempSrc: Oral  PainSc: 0-No pain      Patients Stated Pain Goal: 4 (02/13/21 1152)  Complications: No notable events documented.

## 2021-02-13 NOTE — Op Note (Signed)
Operative Note  Preoperative Diagnosis:  exposure of midurethral sling mesh  Postoperative Diagnosis: same  Procedures performed:  Sling revision  Implants: none  Attending Surgeon: Lanetta Inch, MD  Anesthesia: General LMA  Findings: On vaginal exam, 2-69mm exposure of previously placed midurethral sling    Specimens: * No specimens in log *  Estimated blood loss: 5 mL  IV fluids: 400 mL  Urine output: 50 mL  Complications: none  Indications: 35yo F with stress incontinence who had a midurethral mesh sling placed 12/08/20. At her 6 week post operative visit, a small mesh exposure was noted. The patient elected to proceed with sling revision. Decision was made not to remove any mesh as patient has had good results for treatment of her incontinence.   Procedure in Detail:  After informed consent was obtained, the patient was taken to the operating room where she was placed under anesthesia.  She was then placed in the dorsal lithotomy position with allen stirrups and prepped and draped in the usual sterile fashion.  A strap was placed across her upper abdomen on top of her gown so it was not in direct contact with her skin.  Care was taken to avoid hyperflexion or hyperextension of her upper and lower extremities. A foley catheter was placed.  A lonestar self-retraining retractor was placed with 4 stay hooks. The mid urethral area was located on the anterior vaginal wall.  Two Allis clamps were placed at the level of the midurethra. A 2-71mm mesh exposure was noted at the previous incision site. Thorek scissors were used to undermine the vaginal epithelium adjacent to the mesh on 4 sides. The epithelium was then closed over the exposed mesh with a 2-0 vicryl suture in an interrupted fashion. 1% lidocaine with epinephrine was injected into the vaginal mucosa. The patient tolerated the procedure well.  She was awakened from anesthesia and transferred to the recovery room in stable  condition. All needle and sponge counts were correct x 2.    Marguerita Beards, MD

## 2021-02-13 NOTE — Anesthesia Preprocedure Evaluation (Addendum)
Anesthesia Evaluation  Patient identified by MRN, date of birth, ID band Patient awake    Reviewed: Allergy & Precautions, NPO status , Patient's Chart, lab work & pertinent test results  Airway Mallampati: III       Dental   Pulmonary former smoker,    Pulmonary exam normal        Cardiovascular hypertension, Pt. on home beta blockers and Pt. on medications Normal cardiovascular exam  ECG: NSR, rate 67   Neuro/Psych PSYCHIATRIC DISORDERS Anxiety Depression Bipolar Disorder negative neurological ROS     GI/Hepatic negative GI ROS, Neg liver ROS,   Endo/Other  Morbid obesity  Renal/GU negative Renal ROS     Musculoskeletal negative musculoskeletal ROS (+)   Abdominal (+) + obese,   Peds  Hematology negative hematology ROS (+)   Anesthesia Other Findings exposure of urethral mesh  Reproductive/Obstetrics hcg negative                            Anesthesia Physical Anesthesia Plan  ASA: 3  Anesthesia Plan: General   Post-op Pain Management:    Induction: Intravenous  PONV Risk Score and Plan: 3 and Ondansetron, Dexamethasone, Midazolam and Treatment may vary due to age or medical condition  Airway Management Planned: LMA  Additional Equipment:   Intra-op Plan:   Post-operative Plan: Extubation in OR  Informed Consent: I have reviewed the patients History and Physical, chart, labs and discussed the procedure including the risks, benefits and alternatives for the proposed anesthesia with the patient or authorized representative who has indicated his/her understanding and acceptance.     Dental advisory given  Plan Discussed with: CRNA  Anesthesia Plan Comments:        Anesthesia Quick Evaluation

## 2021-02-17 ENCOUNTER — Encounter (HOSPITAL_BASED_OUTPATIENT_CLINIC_OR_DEPARTMENT_OTHER): Payer: Self-pay | Admitting: Obstetrics and Gynecology

## 2021-02-19 ENCOUNTER — Encounter (HOSPITAL_BASED_OUTPATIENT_CLINIC_OR_DEPARTMENT_OTHER): Payer: Self-pay | Admitting: *Deleted

## 2021-03-26 NOTE — Progress Notes (Signed)
[facility]  Date of Visit: 03/27/2021  History of Present Illness: Lisa Rich is a 36 y.o. female scheduled today for a post-operative visit.   Surgery: s/p Laparoscopic bilateral salpingectomy, midurethral sling (Advantage Fit), cystoscopy on 12/08/20 then had a mesh revision for mesh exposure on 02/13/21.  Denies vaginal bleeding, pain or symptoms of UTI. She has not had any urinary leakage.   Medications: She has a current medication list which includes the following prescription(s): [START ON 03/30/2021] estradiol, acetaminophen, alprazolam, buspirone, clobetasol cream, hydrochlorothiazide, ibuprofen, lamotrigine, mirabegron er, oxycodone, polyethylene glycol powder, propranolol er, and vraylar.   Allergies: Patient has No Known Allergies.   Physical Exam: BP 136/82    Pulse 92   Pelvic Examination: Vagina: Suburethral incision intact with vicryl sutures still in place. No visible or palpable mesh.  ---------------------------------------------------------  Assessment and Plan:  1. Delayed surgical wound healing, initial encounter     - Suburethral incision still not completely healed. Will prescribe estrace cream. She should place 0.5g nightly for two weeks.  - Still continue pelvic rest/ lifting/ exercise restrictions until complete healing has been obtained.  - Return 2 weeks for post op check.   Marguerita Beards, MD

## 2021-03-27 ENCOUNTER — Encounter: Payer: Self-pay | Admitting: Obstetrics and Gynecology

## 2021-03-27 ENCOUNTER — Other Ambulatory Visit: Payer: Self-pay

## 2021-03-27 ENCOUNTER — Ambulatory Visit (INDEPENDENT_AMBULATORY_CARE_PROVIDER_SITE_OTHER): Payer: BC Managed Care – PPO | Admitting: Obstetrics and Gynecology

## 2021-03-27 VITALS — BP 136/82 | HR 92

## 2021-03-27 DIAGNOSIS — T8189XA Other complications of procedures, not elsewhere classified, initial encounter: Secondary | ICD-10-CM

## 2021-03-27 MED ORDER — ESTRADIOL 0.1 MG/GM VA CREA
0.5000 g | TOPICAL_CREAM | VAGINAL | 11 refills | Status: DC
Start: 2021-03-30 — End: 2023-05-12

## 2021-04-21 ENCOUNTER — Ambulatory Visit (INDEPENDENT_AMBULATORY_CARE_PROVIDER_SITE_OTHER): Payer: BC Managed Care – PPO | Admitting: Obstetrics and Gynecology

## 2021-04-21 ENCOUNTER — Encounter: Payer: Self-pay | Admitting: Obstetrics and Gynecology

## 2021-04-21 ENCOUNTER — Other Ambulatory Visit: Payer: Self-pay

## 2021-04-21 VITALS — BP 131/84 | HR 99

## 2021-04-21 DIAGNOSIS — Z9889 Other specified postprocedural states: Secondary | ICD-10-CM

## 2021-04-21 NOTE — Progress Notes (Signed)
[facility]  Date of Visit: 04/21/2021  History of Present Illness: Ms. Garmany is a 36 y.o. female scheduled today for a post-operative visit.   Surgery: s/p Laparoscopic bilateral salpingectomy, midurethral sling (Advantage Fit), cystoscopy on 12/08/20 then had a mesh revision for mesh exposure on 02/13/21.  Denies vaginal bleeding, pain or symptoms of UTI. She has not had any urinary leakage.   Medications: She has a current medication list which includes the following prescription(s): cariprazine, estradiol, lamotrigine, and mirabegron er.   Allergies: Patient has No Known Allergies.   Physical Exam: BP 131/84    Pulse 99   Pelvic Examination: Vagina: Suburethral incision intact with no sutures present. No visible or palpable mesh.  ---------------------------------------------------------  Assessment and Plan:  1. Post-operative state     - Suburethral incision well healed.  - Still is cleared from any restrictions and can resume regular activity including exercise or intercourse if desired.   Return as needed  Marguerita Beards, MD

## 2021-04-22 ENCOUNTER — Encounter: Payer: Self-pay | Admitting: Obstetrics and Gynecology

## 2021-05-28 ENCOUNTER — Encounter: Payer: Self-pay | Admitting: Obstetrics and Gynecology

## 2021-06-22 HISTORY — PX: LAPAROSCOPIC GASTRIC BYPASS: SUR771

## 2022-02-17 ENCOUNTER — Encounter (HOSPITAL_COMMUNITY): Payer: BC Managed Care – PPO

## 2022-02-17 DIAGNOSIS — Z1231 Encounter for screening mammogram for malignant neoplasm of breast: Secondary | ICD-10-CM

## 2022-02-25 ENCOUNTER — Other Ambulatory Visit: Payer: Self-pay | Admitting: Family Medicine

## 2022-02-25 DIAGNOSIS — Z1231 Encounter for screening mammogram for malignant neoplasm of breast: Secondary | ICD-10-CM

## 2022-03-04 ENCOUNTER — Ambulatory Visit
Admission: RE | Admit: 2022-03-04 | Discharge: 2022-03-04 | Disposition: A | Discharge status: Home / Self Care | Payer: BC Managed Care – PPO | Source: Ambulatory Visit | Attending: Family Medicine | Admitting: Family Medicine

## 2022-03-04 DIAGNOSIS — Z1231 Encounter for screening mammogram for malignant neoplasm of breast: Secondary | ICD-10-CM | POA: Insufficient documentation

## 2022-05-24 HISTORY — PX: BREAST ENHANCEMENT SURGERY: SHX7

## 2023-05-12 ENCOUNTER — Encounter: Payer: Self-pay | Admitting: Obstetrics & Gynecology

## 2023-05-12 ENCOUNTER — Ambulatory Visit (INDEPENDENT_AMBULATORY_CARE_PROVIDER_SITE_OTHER): Payer: BC Managed Care – PPO | Admitting: Obstetrics & Gynecology

## 2023-05-12 ENCOUNTER — Other Ambulatory Visit (HOSPITAL_COMMUNITY)
Admission: RE | Admit: 2023-05-12 | Discharge: 2023-05-12 | Disposition: A | Discharge status: Home / Self Care | Source: Ambulatory Visit | Attending: Obstetrics & Gynecology | Admitting: Obstetrics & Gynecology

## 2023-05-12 VITALS — BP 125/76 | HR 58 | Ht 70.0 in | Wt 180.0 lb

## 2023-05-12 DIAGNOSIS — Z113 Encounter for screening for infections with a predominantly sexual mode of transmission: Secondary | ICD-10-CM | POA: Insufficient documentation

## 2023-05-12 DIAGNOSIS — Z01419 Encounter for gynecological examination (general) (routine) without abnormal findings: Secondary | ICD-10-CM | POA: Insufficient documentation

## 2023-05-12 NOTE — Progress Notes (Signed)
 Patient presents for Annual.  LMP: 04/23/2023 Last pap:  03/01/19 Contraception:  BTL Mammogram:  03/04/2022 WNL Breast Implants   STD Screening: Accepts   CC: Annual/None

## 2023-05-12 NOTE — Progress Notes (Signed)
 GYNECOLOGY ANNUAL PREVENTATIVE CARE ENCOUNTER NOTE  History:     Lisa Rich is a 38 y.o. G51P2002 female here for a routine annual gynecologic exam.  Current complaints: No current complaints.   She denies abnormal vaginal bleeding, discharge, pelvic pain, problems with intercourse or other gynecologic concerns.   Of note, permission was obtained from patient to have a medical student present during this encounter and to participate in the physical examination.  Gynecologic History Patient's last menstrual period was 04/23/2023. Contraception: tubal ligation Last Pap: 03/01/2019. Result was normal with negative HPV Last Mammogram: 03/04/2022.  Result was normal   Obstetric History OB History  Gravida Para Term Preterm AB Living  2 2 2  0 0 2  SAB IAB Ectopic Multiple Live Births  0 0 0 0 2    # Outcome Date GA Lbr Len/2nd Weight Sex Type Anes PTL Lv  2 Term 06/03/11 [redacted]w[redacted]d 17:33 / 00:32 9 lb 6.3 oz (4.26 kg) M Vag-Spont EPI  LIV  1 Term 05/11/06 [redacted]w[redacted]d  7 lb 10 oz (3.459 kg) M Vag-Spont EPI  LIV    Past Medical History:  Diagnosis Date   Abnormal Pap smear 2007   Anxiety and depression    Bipolar 1 disorder (HCC)    History of pre-eclampsia 04/19/2011   Hypertension    Pregnancy induced hypertension 2008   preclampsia    Past Surgical History:  Procedure Laterality Date   BLADDER SUSPENSION N/A 12/08/2020   Procedure: TRANSVAGINAL TAPE (TVT) PROCEDURE;  Surgeon: Marguerita Beards, MD;  Location: [facility];  Service: Gynecology;  Laterality: N/A;   BREAST ENHANCEMENT SURGERY Bilateral 05/2022   CYSTOSCOPY N/A 12/08/2020   Procedure: CYSTOSCOPY;  Surgeon: Marguerita Beards, MD;  Location: [facility];  Service: Gynecology;  Laterality: N/A;   EXCISION OF MESH N/A 02/13/2021   Procedure: mesh revision;  Surgeon: Marguerita Beards, MD;  Location: [facility];  Service: Gynecology;  Laterality: N/A;  total time  requested is   LAPAROSCOPIC GASTRIC BYPASS  06/2021   TUBAL LIGATION N/A 12/08/2020   Procedure: BILATERAL SALPINGECTOMY;  Surgeon: Marguerita Beards, MD;  Location: [facility];  Service: Gynecology;  Laterality: N/A;   WISDOM TOOTH EXTRACTION  2011    x 4    No current outpatient medications on file prior to visit.   No current facility-administered medications on file prior to visit.    No Known Allergies  Social History:  reports that she quit smoking about 5 years ago. Her smoking use included cigarettes. She started smoking about 20 years ago. She has a 15 pack-year smoking history. She has never used smokeless tobacco. She reports that she does not drink alcohol and does not use drugs.  Family History  Problem Relation Age of Onset   Hypertension Mother    Depression Mother    Hypertension Father    Bipolar disorder Father    Diabetes Maternal Grandmother    Asthma Maternal Grandmother    Heart disease Maternal Grandmother        CHF   Heart disease Maternal Grandfather        CHF   Breast cancer Neg Hx     The following portions of the patient's history were reviewed and updated as appropriate: allergies, current medications, past family history, past medical history, past social history, past surgical history and problem list.  Review of Systems Pertinent items noted in HPI and remainder of comprehensive  ROS otherwise negative.  Physical Exam:  BP 125/76   Pulse (!) 58   Ht 5\' 10"  (1.778 m)   Wt 180 lb (81.6 kg)   LMP 04/23/2023   BMI 25.83 kg/m  CONSTITUTIONAL: Well-developed, well-nourished female in no acute distress.  HENT:  Normocephalic, atraumatic, External right and left ear normal.  EYES: Conjunctivae and EOM are normal. Pupils are equal, round, and reactive to light. No scleral icterus.  NECK: Normal range of motion, supple, no masses.  Normal thyroid.  SKIN: Skin is warm and dry. No rash noted. Not diaphoretic. No erythema.  No pallor. MUSCULOSKELETAL: Normal range of motion. No tenderness.  No cyanosis, clubbing, or edema. NEUROLOGIC: Alert and oriented to person, place, and time. Normal reflexes, muscle tone coordination.  PSYCHIATRIC: Normal mood and affect. Normal behavior. Normal judgment and thought content. CARDIOVASCULAR: Normal heart rate noted, regular rhythm RESPIRATORY: Clear to auscultation bilaterally. Effort and breath sounds normal, no problems with respiration noted. BREASTS: Symmetric in size, bilateral implants in place. No masses, tenderness, skin changes, nipple drainage, or lymphadenopathy bilaterally. Performed in the presence of a chaperone. ABDOMEN: Soft, no distention noted.  No tenderness, rebound or guarding.  PELVIC: Normal appearing external genitalia and urethral meatus; normal appearing vaginal mucosa and cervix.  No abnormal vaginal discharge noted.  Pap smear obtained by medical student.  Normal uterine size, no other palpable masses, no uterine or adnexal tenderness.  Performed in the presence of a chaperone and under the supervision of the attending physician.   Assessment and Plan:     1. Routine screening for STI (sexually transmitted infection) We will follow up with her pending results of the study. - Cytology - PAP - RPR+HBsAg+HCVAb+HIV  2. Well woman exam with routine gynecological exam (Primary) We will follow up with the patient following the results of her Pap smear - Cytology - PAP Will follow up results of pap smear and manage accordingly. Normal breast examination today, she was advised to perform periodic self breast examinations.  Routine preventative health maintenance measures emphasized. Please refer to After Visit Summary for other counseling recommendations.    Ophelia Charter Sheilah Mins [facility] Medical Student  Attestation of Attending Supervision of Student:  I confirm that I have verified the information documented in the medical student's note and that I have  also personally reperformed the history, physical exam and all medical decision making activities.  I have verified that all services and findings are accurately documented in this student's note; and I agree with management and plan as outlined in the documentation. I have also made any necessary editorial changes.   Jaynie Collins, MD, FACOG Attending Obstetrician & Gynecologist, [facility] for [employer], [facility]

## 2023-05-13 ENCOUNTER — Encounter: Payer: Self-pay | Admitting: Obstetrics & Gynecology

## 2023-05-13 LAB — CYTOLOGY - PAP
Chlamydia: NEGATIVE
Comment: NEGATIVE
Comment: NEGATIVE
Comment: NEGATIVE
Comment: NORMAL
Diagnosis: NEGATIVE
High risk HPV: NEGATIVE
Neisseria Gonorrhea: NEGATIVE
Trichomonas: NEGATIVE

## 2023-05-14 ENCOUNTER — Encounter: Payer: Self-pay | Admitting: Obstetrics & Gynecology

## 2023-05-14 LAB — RPR+HBSAG+HCVAB+...
HIV Screen 4th Generation wRfx: NONREACTIVE
Hep C Virus Ab: NONREACTIVE
Hepatitis B Surface Ag: NEGATIVE
RPR Ser Ql: NONREACTIVE
